# Patient Record
Sex: Female | Born: 1940 | Race: Black or African American | Hispanic: No | Smoking: Never smoker
Health system: Southern US, Community
[De-identification: ages and names within clinical notes are randomized; demographics above are authoritative.]

## PROBLEM LIST (undated history)

## (undated) DIAGNOSIS — J301 Allergic rhinitis due to pollen: Secondary | ICD-10-CM

## (undated) DIAGNOSIS — E7800 Pure hypercholesterolemia, unspecified: Secondary | ICD-10-CM

## (undated) DIAGNOSIS — J302 Other seasonal allergic rhinitis: Secondary | ICD-10-CM

## (undated) DIAGNOSIS — E782 Mixed hyperlipidemia: Secondary | ICD-10-CM

## (undated) DIAGNOSIS — I5030 Unspecified diastolic (congestive) heart failure: Secondary | ICD-10-CM

## (undated) DIAGNOSIS — N1832 Chronic kidney disease, stage 3b: Secondary | ICD-10-CM

## (undated) DIAGNOSIS — I779 Disorder of arteries and arterioles, unspecified: Secondary | ICD-10-CM

## (undated) DIAGNOSIS — I509 Heart failure, unspecified: Secondary | ICD-10-CM

## (undated) DIAGNOSIS — I2510 Atherosclerotic heart disease of native coronary artery without angina pectoris: Secondary | ICD-10-CM

## (undated) DIAGNOSIS — M1990 Unspecified osteoarthritis, unspecified site: Secondary | ICD-10-CM

## (undated) DIAGNOSIS — I10 Essential (primary) hypertension: Secondary | ICD-10-CM

## (undated) DIAGNOSIS — I4891 Unspecified atrial fibrillation: Secondary | ICD-10-CM

## (undated) HISTORY — PX: TOTAL ABDOMINAL HYSTERECTOMY: SHX209

## (undated) HISTORY — DX: Pure hypercholesterolemia, unspecified: E78.00

## (undated) HISTORY — DX: Other seasonal allergic rhinitis: J30.2

## (undated) HISTORY — DX: Unspecified osteoarthritis, unspecified site: M19.90

## (undated) HISTORY — DX: Allergic rhinitis due to pollen: J30.1

## (undated) HISTORY — DX: Essential (primary) hypertension: I10

## (undated) HISTORY — PX: ANKLE SURGERY: SHX546

## (undated) HISTORY — PX: OTHER SURGICAL HISTORY: SHX169

## (undated) HISTORY — PX: TOTAL KNEE ARTHROPLASTY: SHX125

## (undated) HISTORY — PX: TONSILLECTOMY: SHX5217

---

## 2004-08-05 ENCOUNTER — Ambulatory Visit: Payer: Self-pay | Admitting: Orthopedic Surgery

## 2004-08-24 ENCOUNTER — Ambulatory Visit: Payer: Self-pay | Admitting: Orthopedic Surgery

## 2004-08-24 ENCOUNTER — Inpatient Hospital Stay (HOSPITAL_COMMUNITY): Admission: RE | Admit: 2004-08-24 | Discharge: 2004-08-27 | Payer: Self-pay | Admitting: Orthopedic Surgery

## 2004-10-04 ENCOUNTER — Ambulatory Visit: Payer: Self-pay | Admitting: Orthopedic Surgery

## 2004-12-06 ENCOUNTER — Ambulatory Visit: Payer: Self-pay | Admitting: Orthopedic Surgery

## 2005-03-07 ENCOUNTER — Ambulatory Visit: Payer: Self-pay | Admitting: Orthopedic Surgery

## 2006-04-18 IMAGING — CR DG KNEE 1-2V PORT*L*
2 series · 2 of 2 positions shown · non-contrast
Comparison: [facility]ad.

CLINICAL DATA: Post lt total knee replacement.
 PORTABLE LEFT KNEE:

[view not recorded (1 of 2)]
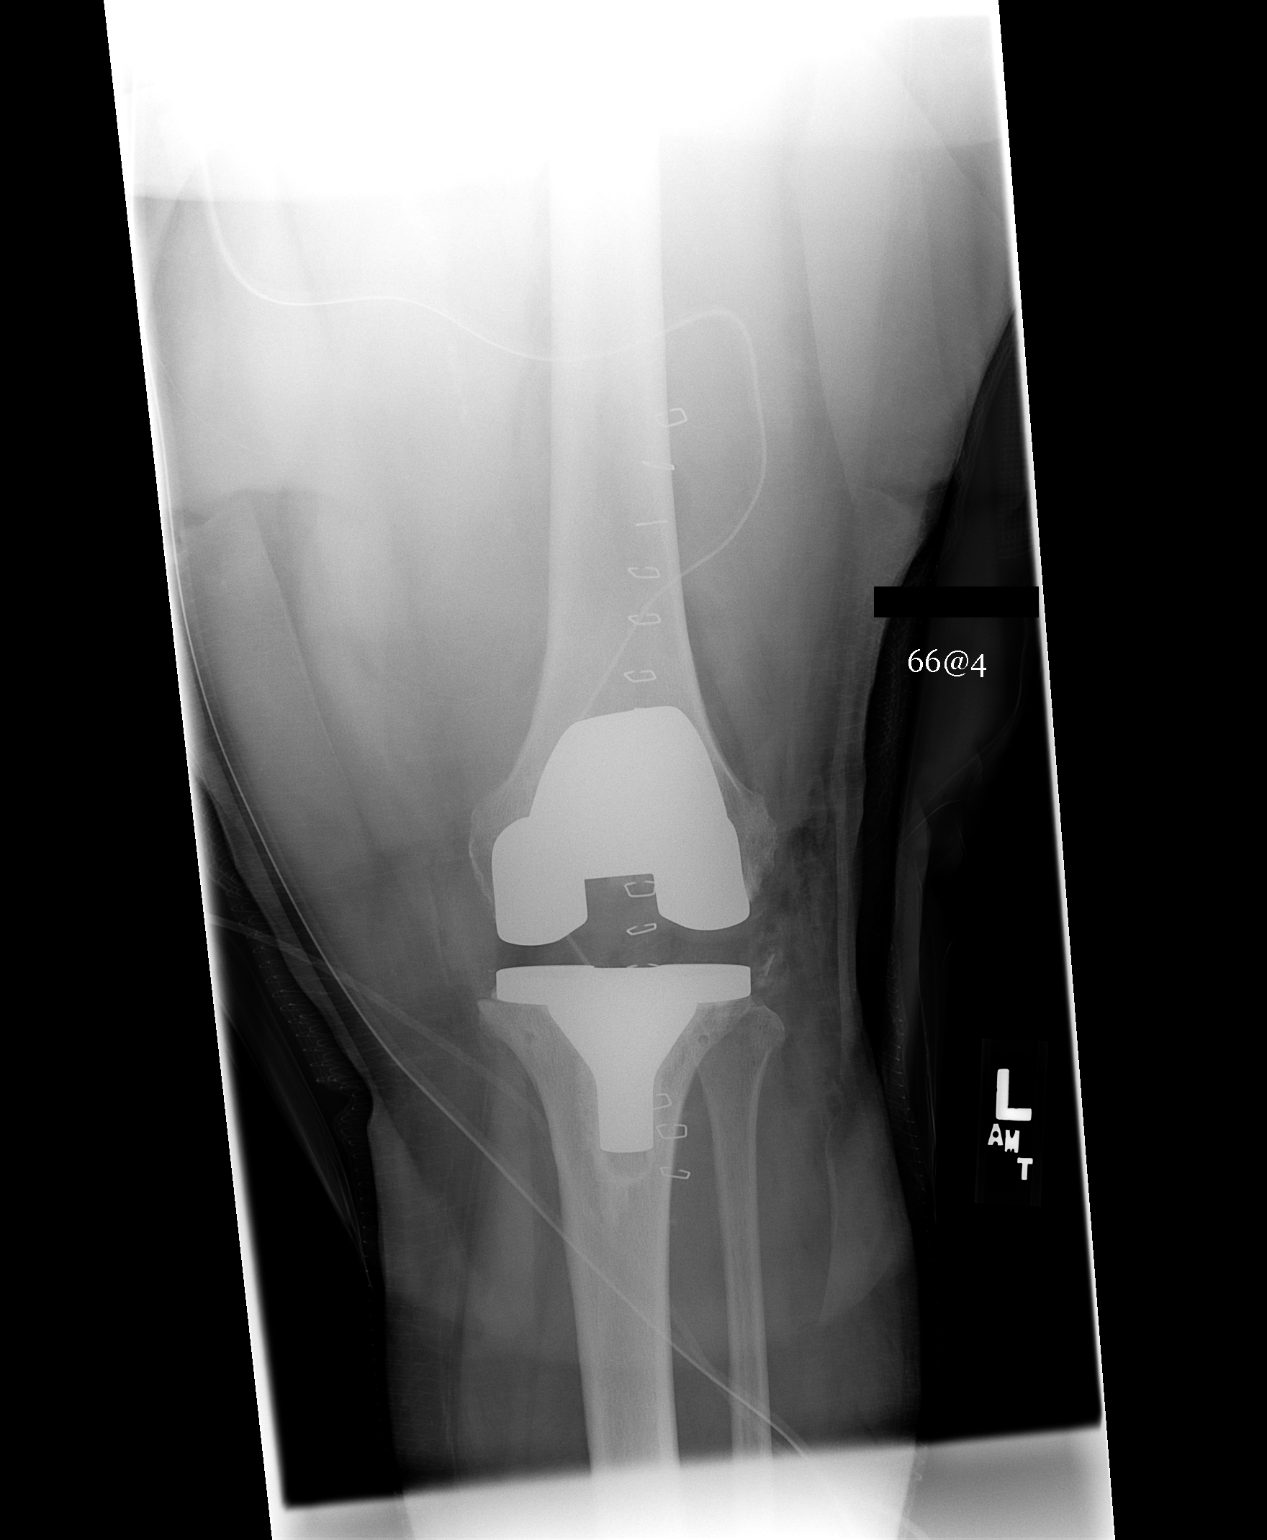

[view not recorded (2 of 2)]
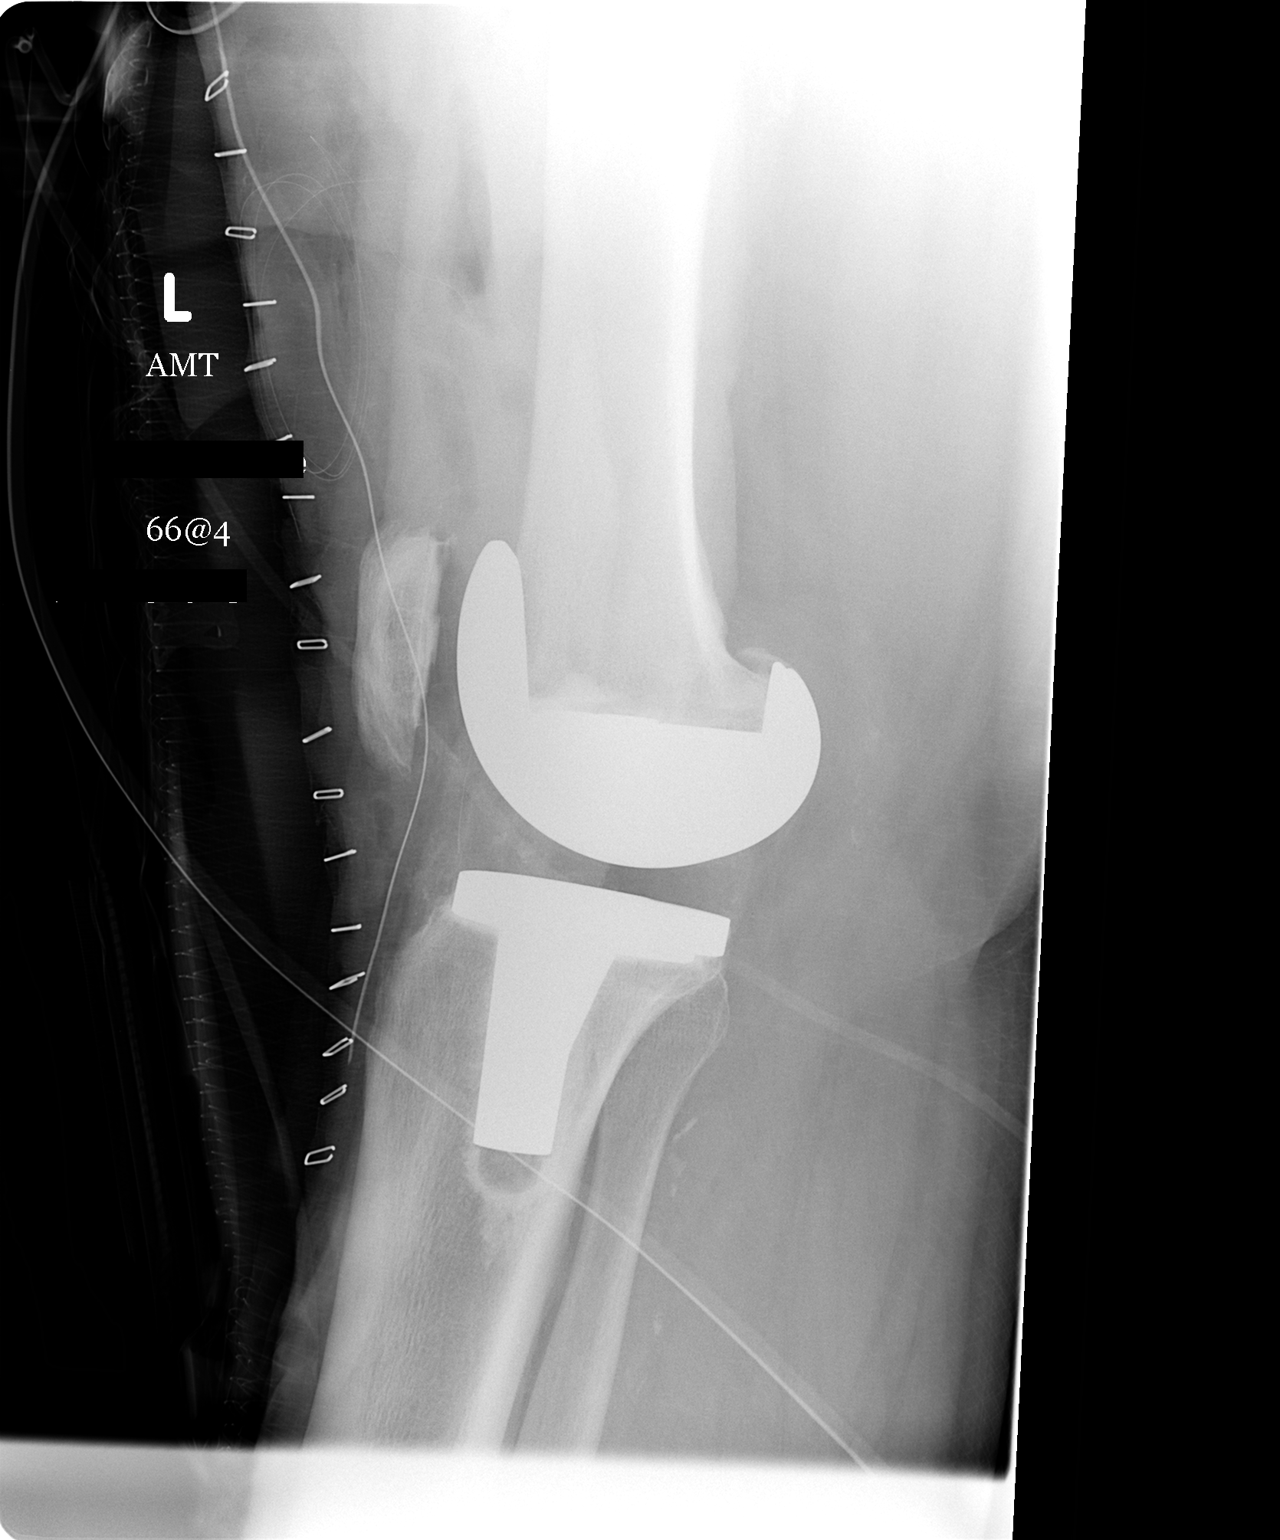

[2 of 2 positions shown; findings below may reference images not displayed]

AP and cross table lateral views show the patient status post left total knee replacement.  Good position and alignment of the prosthetic components and bony elements in two views.  There is a surgical drain in the joint space.
IMPRESSION: Good position and alignment following left total knee arthroplasty.

## 2009-11-04 ENCOUNTER — Encounter: Payer: Self-pay | Admitting: Orthopedic Surgery

## 2009-11-18 ENCOUNTER — Ambulatory Visit: Payer: Self-pay | Admitting: Orthopedic Surgery

## 2009-11-18 DIAGNOSIS — Z96659 Presence of unspecified artificial knee joint: Secondary | ICD-10-CM

## 2010-04-13 ENCOUNTER — Encounter: Payer: Self-pay | Admitting: Orthopedic Surgery

## 2010-07-06 NOTE — Letter (Signed)
Summary: History form  History form   Imported By: Jacklynn Ganong 11/23/2009 12:47:08  _____________________________________________________________________  External Attachment:    Type:   Image     Comment:   External Document

## 2010-07-06 NOTE — Letter (Signed)
Summary: Permanent handic placard  Permanent handic placard   Imported By: Cammie Sickle 11/10/2009 09:27:55  _____________________________________________________________________  External Attachment:    Type:   Image     Comment:   External Document

## 2010-07-06 NOTE — Letter (Signed)
Summary: Handicapp sticker renewal  Handicapp sticker renewal   Imported By: Jacklynn Ganong 04/13/2010 16:15:26  _____________________________________________________________________  External Attachment:    Type:   Image     Comment:   External Document

## 2010-07-06 NOTE — Assessment & Plan Note (Signed)
Summary: RECK/XR LEFT TOTAL KNEE/MEDICARE/BSF   Vital Signs:  Patient profile:   70 year old female Height:      63 inches Weight:      302 pounds Pulse rate:   80 / minute Resp:     16 per minute  Vitals Entered By: Ether Griffins (November 18, 2009 10:51 AM)  Visit Type:  follow up on total knee Referring Provider:  self Primary Provider:  Dr. Salvatore Decent  CC:  follow up on total knee.  History of Present Illness: I saw Katherine Hanson in the office today for a followup visit.  She is a 70 years old woman with the complaint of:  follow up on left total knee today with xrays.  08/24/2004 TKA left knee, Depuy Implant.  Meds: Benicar/HCT, Lopressor, Pravastatin, Mobic, Zyrtec, Travatan eye drops.  We have not seen this patient in over 3 years.she does take some Mobic for other joint pain.    Allergies (verified): No Known Drug Allergies  Past History:  Past Medical History: high blood pressure high cholesterol arthritis glaucoma hay fever  Past Surgical History: cyst in breast not by Dr. Romeo Apple cyst on wrist not by Dr. Romeo Apple tonsillectomy not by Dr. Romeo Apple hysterectomy not by Dr. Romeo Apple left total knee arthroplasty done by Dr. Romeo Apple broken ankle  Family History: Family History of Arthritis  Social History: Patient is widowed.  retired no smoking no alcohol one cup of caffeine per day  Review of Systems Constitutional:  Denies weight loss, weight gain, fever, chills, and fatigue. Cardiovascular:  Denies chest pain, palpitations, fainting, and murmurs. Respiratory:  Denies short of breath, wheezing, couch, tightness, pain on inspiration, and snoring . Gastrointestinal:  Denies heartburn, nausea, vomiting, diarrhea, constipation, and blood in your stools. Genitourinary:  Denies frequency, urgency, difficulty urinating, painful urination, flank pain, and bleeding in urine. Neurologic:  Denies numbness, tingling, unsteady gait, dizziness,  tremors, and seizure. Musculoskeletal:  Complains of stiffness and muscle pain; denies joint pain, swelling, instability, redness, and heat. Endocrine:  Denies excessive thirst, exessive urination, and heat or cold intolerance. Psychiatric:  Denies nervousness, depression, anxiety, and hallucinations. Skin:  Denies changes in the skin, poor healing, rash, itching, and redness. HEENT:  Complains of redness and watering; denies blurred or double vision and eye pain. Immunology:  Complains of seasonal allergies; denies sinus problems and allergic to bee stings. Hemoatologic:  Denies easy bleeding and brusing.  Physical Exam  Additional Exam:  she is 54 300 pounds short in stature vital signs are stable  She has excellent extension of her knee, normal power in extension.  Flexion 120  Stability is normal in the coronal plane as well as the AP plane   Impression & Recommendations:  Problem # 1:  TOTAL KNEE FOLLOW-UP (ICD-V43.65) Assessment Comment Only  x-rays show normal alignment of the prosthesis and the patella no loosening  Compared to her previous films there appears to be some joint space narrowing medially.  This can be followed with x-ray I do not think it significant  Orders: New Patient Level II (69629) Knee x-ray,  3 views (52841)  Patient Instructions: 1)  2 years xr tka left

## 2010-10-22 NOTE — Consult Note (Signed)
NAME:  Katherine Hanson, Katherine Hanson               ACCOUNT NO.:  [id number]   MEDICAL RECORD NO.:  [id number]          PATIENT TYPE:  INP   LOCATION:  A335                          FACILITY:  [facility]   PHYSICIAN:  Osvaldo Shipper, MD     DATE OF BIRTH:  [birth year]   DATE OF CONSULTATION:  DATE OF DISCHARGE:                                   CONSULTATION   CONSULTING PHYSICIAN:  Osvaldo Shipper, M.D.   REASON FOR CONSULTATION:  Medical problems.   HISTORY OF PRESENT ILLNESS:  This is a 70 year old African American female  with a past medical history of hypertension, arthritis, glaucoma,  hypercholesterolemia who was admitted to the hospital for a left knee  replacement secondary to osteoarthritis.  At the time of evaluation, the  patient was very sedated with her PCA pump.  She was arousable, however,  could not give a proper history and would go back to sleep.  Again on  arousal, the patient denied any kind of complaints at this time.  Specifically, she denied any chest pain, shortness of breath.  She also  denied any nausea, vomiting, abdominal pain.  The patient's daughter was in  the room who mentioned that the patient did not have any active medical  problems at the time of admission and that her blood pressure is usually  under very good control.   MEDICATIONS:  1.  Benicar/HCT 20/12.5, one tablet daily.  2.  Lopressor 100 mg at nighttime.  3.  Zetia 10 mg daily.  4.  Flonase occasionally.  5.  Zyrtec occasionally.  6.  Mobic on a p.r.n. basis.   MEDICATIONS IN THE HOSPITAL:  1.  PCA pump with Dilaudid.  2.  Skelaxin 800 mg every eight hours.  3.  Colace 100 mg b.i.d.  4.  Ferrous sulfate t.i.d.  5.  Dulcolax suppository as needed.  6.  Milk of Magnesia as needed.  7.  Hydrochlorothiazide/Avapro combination.  8.  Lopressor 100 mg twice daily.  9.  Zetia 10 mg daily.  10. Lovenox for DVT prophylaxis.  11. Ancef.   No known drug allergies.   PAST MEDICAL HISTORY:  1.   Hypertension.  2.  Arthritis.  3.  Hypercholesterolemia.  4.  Glaucoma.   SURGICAL HISTORY:  1.  Cyst removed from her wrist.  2.  Hysterectomy.  3.  Tonsillectomy.  4.  Breast biopsy and cyst removal.   SOCIAL HISTORY:  She is widowed and retired, does not smoke or drink, has a  high school education.   FAMILY HISTORY:  Significant just for arthritis.   REVIEW OF SYSTEMS:  Difficult to do on this patient because of her sedation.   PHYSICAL EXAMINATION:  VITAL SIGNS:  She is afebrile.  Heart rate is 89,  respiratory rate is 20, blood pressure is 119/82, saturating 99%.  GENERAL:  Showed a very obese female in no apparent distress, very sedated,  wakes up on calling her name, however, is not able to give proper history.  HEENT:  Pupils were small but reacting and equal bilaterally.  Oral mucous  membranes were moist.  No pallor or icterus was appreciated.  NECK:  Soft and supple.  LUNGS:  Clear to auscultation.  CARDIOVASCULAR:  S1 S2 normal.  Regular.  No murmurs appreciated.  ABDOMEN:  Soft, nontender, nondistended, very obese.  Bowel sounds were  heard.  No organomegaly was appreciated.  EXTREMITIES:  Without edema.  NEUROLOGIC:  The patient was asleep, however, arousable.  No gross  neurological deficits were appreciated.   LABORATORY DATA:  White count 6.1, hemoglobin 12.7, platelet count 235.  These are from August 19, 2004, none available from this time.  Coag profile,  at that time, was also normal.  Her BMP was also normal from August 19, 2004.  No current labs available.   IMAGING STUDIES:  Show good positioning alignment following left total knee  arthroplasty.   IMPRESSION:  This is a 70 year old African American female with past medical  problems for hypertension who presented for a left knee arthroplasty,  following end stage osteoarthritis.  The patient is on a beta-blocker as  well as a diuretic and ARB.  Her blood pressures are optimally controlled at  this  time.   PLAN:  1.  Continue her antihypertensive agents at this time.  Adjustments will be      made and the patient may go back to her outpatient meds once she is      discharged.  2.  We agree with DVT prophylaxis with Lovenox and TED stockings and SCD's.  3.  Recommend cutting down dose of the PCA pump and to hold for sedation.  4.  For diet, we recommend a heart healthy diet.   We thank you for the opportunity to for allowing [place] to consult upon this  patient, and we will be happy to follow this patient along for any medical  needs.      GK/MEDQ  D:  08/24/2004  T:  08/24/2004  Job:  [id number]   cc:   Vickki Hearing, M.D.  Fax: [phone]

## 2010-10-22 NOTE — H&P (Signed)
NAME:  Katherine Hanson, Katherine Hanson               ACCOUNT NO.:  [id number]   MEDICAL RECORD NO.:  [id number]          PATIENT TYPE:  AMB   LOCATION:  DAY                           FACILITY:  [facility]   PHYSICIAN:  Vickki Hearing, M.D.DATE OF BIRTH:  01-31-1941   DATE OF ADMISSION:  DATE OF DISCHARGE:  LH                                HISTORY & PHYSICAL   CHIEF COMPLAINT:  Left knee pain.   HISTORY OF PRESENT ILLNESS:  Ms. Katherine Hanson has end-stage osteoarthritis  of the left knee.  She is 70 years old.  She has trouble with activities of  daily living.  She has had pain for four or five years and it has gotten  worse in the last month.  She is 70 years old and has given informed consent  for a left total knee replacement.   PAST MEDICAL HISTORY:  1.  Seasonal allergies.  2.  Glaucoma.  3.  Joint swelling.  4.  Joint pain.  5.  History of ulcers.   ALLERGIES:  None known.   MEDICAL PROBLEMS:  1.  Hypertension.  2.  Glaucoma.  3.  Arthritis.  4.  Hypercholesterolemia.   SURGERIES:  1.  Cyst removed from her wrist.  2.  Hysterectomy.  3.  Tonsillectomy.  4.  Breast biopsy and cyst removal.   CURRENT MEDICATIONS:  1.  Benicar 20/12.5 mg.  2.  Lopressor 100 mg.  3.  Zetia 10 mg.  4.  Flonase occasionally.  5.  Zyrtec.  6.  Mobic.   FAMILY HISTORY:  Arthritis.   FAMILY PHYSICIAN:  Dr. Salvatore Decent.   SOCIAL HISTORY:  She is widowed and retired.  She does not smoke or drink.  She has a high school education plus four years of college.   PHYSICAL EXAMINATION:  VITAL SIGNS:  Weight 282 pounds, pulse 74,  respiratory rate 18.  GENERAL:  Appearance normal.  HEENT:  No significant abnormalities.  NECK:  Supple.  CHEST:  Clear.  HEART:  Rate and rhythm were normal.  ABDOMEN:  Soft.  EXTREMITIES:  The patient's left knee is tender over the medial compartment.  It has a small joint effusion.  Muscle tone and strength were normal.  The  ligaments were stable with varus alignment.   Her knee shows range of motion,  which is 115 degrees.  Her leg size limits further flexion.   IMPRESSION:  Left knee osteoarthritis.   PLAN:  Left total knee replacement.      SEH/MEDQ  D:  08/23/2004  T:  08/23/2004  Job:  [id number]

## 2010-10-22 NOTE — Discharge Summary (Signed)
NAME:  Katherine Hanson, Katherine Hanson               ACCOUNT NO.:  [id number]   MEDICAL RECORD NO.:  [id number]          PATIENT TYPE:  INP   LOCATION:  A335                          FACILITY:  [facility]   PHYSICIAN:  Vickki Hearing, M.D.DATE OF BIRTH:  March 09, 1941   DATE OF ADMISSION:  08/24/2004  DATE OF DISCHARGE:  03/24/2006LH                                 DISCHARGE SUMMARY   ADMISSION DIAGNOSIS:  Osteoarthritis of the left knee.   DATE OF SURGERY:  August 24, 2004.   OPERATING SURGEON:  Vickki Hearing, M.D.   IMPLANT USED:  DePuy total knee system, 2.5 femur, 2 tibia, 35 mm patella,  12.5 posterior stabilized insert.   ESTIMATED BLOOD LOSS:  Minimal.   COMPLICATIONS:  No complications were noted during the procedure.   HISTORY:  Ms. Theard is a 70 year old African-American female with a past  medical history of hypertension, arthritis, glaucoma, hypercholesterolemia,  and obesity who presented with end-stage left knee osteoarthritis.  She had  a primary complaint of pain and she consented to undergo left total knee  replacement.   HOSPITAL COURSE:  The patient was admitted on August 24, 2004, had the  surgery done on August 24, 2004, and then started a postoperative total knee  protocol, including Lovenox, CPM, and 24 hours of antibiotics.  She did  well.  She progressed to ambulation of 15 feet, passive range of motion 0 to  60.  She tolerated the CPM well.   She had a medical consult, no additional interventions were necessary.   Her hemoglobin at discharge was 10.1.   DISCHARGE MEDICATIONS:  She will resume all previous medications with the  additions of:  1.  Colace 100 mg b.i.d.  2.  Lovenox 30 mg subcutaneously q.12h. x25 days.  3.  Zetia 10 mg daily.  4.  Hydrochlorothiazide 12.5 mg daily.  5.  Lortab one tablet q.4h. p.r.n. pain.  6.  Avapro 150 mg p.o. daily.  7.  Iron 45 mg p.o. t.i.d.  8.  Lopressor 100 mg daily.   FOLLOWUP:  The patient will followup within the next  month for x-rays and  clinical evaluation.   Her staples will be taken out by home care on postoperative day #14, which  is September 07, 2004.   DISPOSITION:  Home.   CONDITION ON DISCHARGE:  Improved.      SEH/MEDQ  D:  08/27/2004  T:  08/27/2004  Job:  [id number]

## 2010-10-22 NOTE — Op Note (Signed)
NAME:  COSTELLA, SCHWARZ               ACCOUNT NO.:  [id number]   MEDICAL RECORD NO.:  [id number]          PATIENT TYPE:  AMB   LOCATION:  DAY                           FACILITY:  [facility]   PHYSICIAN:  Vickki Hearing, M.D.DATE OF BIRTH:  [birth year]   DATE OF PROCEDURE:  08/24/2004  DATE OF DISCHARGE:                                 OPERATIVE REPORT   INDICATIONS FOR PROCEDURE:  This is a 70 year old female with end-stage  osteoarthritis of the left knee, severe varus deformity with bone on bone x-  ray changes and pain. She has trouble with her activities of daily living,  and presented for a left total knee replacement.   PREOPERATIVE DIAGNOSES:  Osteoarthritis of the left knee.   POSTOPERATIVE DIAGNOSES:  Osteoarthritis of the left knee.   PROCEDURE:  Left total knee arthroplasty.   SURGEON:  Vickki Hearing, M.D.   ASSISTANT:  None.   ANESTHESIA:  Spinal.   IMPLANTS:  DePuy 2.5 femur, 2.0 tibia, 35.0 mm patella 12.5 stabilized  insert.   COMPLICATIONS:  None. The counts were correct at the end of the procedure.   ESTIMATED BLOOD LOSS:  Blood loss was minimal.   DESCRIPTION OF PROCEDURE:  Ms. Knarr was properly identified in the preop  holding area. The left knee was marked for surgery and countersigned by the  surgeon. She was given Ancef one hour prior to the  incision and taken to  the operating room, after her history and physical was updated and given a  spinal anesthetic. She had a Foley catheter placed. A tourniquet was placed  on the left thigh.  The left lower extremity was prepped and draped using  sterile technique.  After draping, a time-out was taken as required. We  confirmed that the antibiotics were given, the procedure was a left total  knee, that the patient was [place] [employer], and that the implants were  available and in the hospital.   A straight incision was made over the left knee, centered over the patella.  The subcutaneous tissue was divided.   A medial arthrotomy was performed. The  patella was everted and the tibia was subluxated forward. The PCL was  removed.  The ACL was removed.  The medial and lateral menisci were removed,  and a medial release was done. The tibial guide was set for a 0 degree cut,  and 10 mm of bone was resected from the high lateral side.  The tibia sized  to a size 2.0.   The femoral canal was entered with a 3/8th inch drill bit. It was  decompressed. The femoral cut was set for size #9 and 9 mm of bone was  resected. The extension gap was checked and the extension gap felt tight, so  2 mm of additional bone was removed and then a 10 mm insert fit easily into  the gap.  We then put the 4 in 1 cutting block on after measuring the femur  to a 2.5.  We set it for external rotation and made our four cuts.  The  flexion gap was then  checked and the  10 mm block fit well.   The box cut was made and the trial implants were inserted. The  flexion gap  seemed to be tight at terminal flexion with the tray lifting up, so further  additional release of residual PCL fibers were removed. This opened up the  flexion gap and allowed the tray to stay still during flexion. We trialed  with a 12.5, and it seemed to give the best flexion stability while still  maintaining full extension.   The patella was cut from 23 down to 12,  and three peg holes were made. The  patella was trialed with the trial implants in place, and no lateral release  was needed. The knee felt stable in flexion and extension, and the trial  components were removed. The knee was irrigated and the bone was dried.   The prosthesis components were implanted and cemented in place. Excess  cement was removed. The 10.0 and 12.5 inserts were trialed again.  The 12.5  gave the best stability and a 12.5 insert was placed.   The pain pump catheter was then inserted into the joint, and 30 cc of  Marcaine was injected around the soft tissues of the joint,  and the extensor  mass mechanism was closed with Bralon suture, followed by insertion of a  Hemovac in the subcu tissue, and then #0 and #2-0 Monocryl suture was used  to close the subcu layer.  Skin staples were used to close the skin. A  sterile bandage and a cryo cuff were applied. Tourniquet was released after  1 hour and 20 minutes, and sterile dressings had been applied.  The patient was then taken to the recovery room in stable condition.   POSTOPERATIVE PLAN:  Routine protocol for a total knee.      SEH/MEDQ  D:  08/24/2004  T:  08/24/2004  Job:  [id number]

## 2011-01-19 ENCOUNTER — Ambulatory Visit (INDEPENDENT_AMBULATORY_CARE_PROVIDER_SITE_OTHER): Payer: Medicare Other | Admitting: Orthopedic Surgery

## 2011-01-19 ENCOUNTER — Other Ambulatory Visit: Payer: Self-pay

## 2011-01-19 ENCOUNTER — Encounter: Payer: Self-pay | Admitting: Orthopedic Surgery

## 2011-01-19 VITALS — Resp 20 | Ht 63.0 in | Wt 314.0 lb

## 2011-01-19 DIAGNOSIS — M19079 Primary osteoarthritis, unspecified ankle and foot: Secondary | ICD-10-CM

## 2011-01-19 MED ORDER — TRAMADOL-ACETAMINOPHEN 37.5-325 MG PO TABS: 1.0000 | ORAL_TABLET | Freq: Four times a day (QID) | ORAL | Status: AC | PRN

## 2011-01-19 NOTE — Patient Instructions (Signed)
Wear AFO for most of the time you are walking   Continue meloxicam  Use ultracet for pain if meloxicam not working   [employer], [phone] to schedule an appointment to get the brace made

## 2011-01-19 NOTE — Progress Notes (Signed)
Chief complaint: right ankle pain  HPI:(5) A 70 year old female status post open treatment internal fixation of the RIGHT ankle in 2008 by Dr. Rosetta Posner of [place],  [place] presents with a one-month history of gradual onset of total 8/10 intermittent RIGHT ankle pain associated with swelling and associated with standing for long periods of time.  Swelling is relieved by elevation.  The patient is on arthritis medication.  ROS:(2) weight gain, eyeswatering, snoring, seasonal allergies   PFSH: (1) ankle surgery right 2008  Physical Exam(12) GENERAL: normal development, obesity   CDV: pulses are normal   Skin: surgical scars knee and ankle   Lymph: nodes were not palpable/normal  Psychiatric: awake, alert and oriented  Neuro: normal sensation  MSK Ambulation is normal without assistive device, there is no limping noted.   1RIGHT ankle there is a lateral scar which is minimally tender to palpation there is a medial transverse scar which appears to be posttraumatic which is transverse and tender and sunken and from scarring, proximal and distal to this there is swelling. 2 Range of motion plantar flexion is 15 dorsiflexion is 5 3 Strength assessment motor is 5 over 5 for plantar and dorsiflexion 4  The ankle is stable to anterior drawer test   Assessment: X-rays are obtained in the office of the RIGHT ankle Separate x-ray report 3 views RIGHT ankle Reason for x-ray ankle pain status post open treatment internal fixation  The ankle mortise is reduced.  There is a 5 hole lateral plate with a healed lateral malleolar fracture.  There is one malleolar screw on the medial side for previous medial malleolus fracture which is healed.  Her is narrowing of the ankle joint space circumferentially.  Impression posttraumatic osteoarthritis ankle joint, with internal fixation.  Plan: Recommend AFO brace.  Continue anti-inflammatory.  Start Ultracet for pain.  Followup as needed

## 2011-01-20 ENCOUNTER — Ambulatory Visit: Payer: Medicare Other | Admitting: Orthopedic Surgery

## 2013-09-05 ENCOUNTER — Ambulatory Visit (INDEPENDENT_AMBULATORY_CARE_PROVIDER_SITE_OTHER): Payer: Medicare Other | Admitting: Orthopedic Surgery

## 2013-09-05 ENCOUNTER — Ambulatory Visit (INDEPENDENT_AMBULATORY_CARE_PROVIDER_SITE_OTHER): Payer: Medicare Other

## 2013-09-05 VITALS — BP 175/88 | Ht 63.0 in | Wt 292.0 lb

## 2013-09-05 DIAGNOSIS — M25569 Pain in unspecified knee: Secondary | ICD-10-CM

## 2013-09-05 DIAGNOSIS — M1710 Unilateral primary osteoarthritis, unspecified knee: Secondary | ICD-10-CM

## 2013-09-05 DIAGNOSIS — M179 Osteoarthritis of knee, unspecified: Secondary | ICD-10-CM

## 2013-09-05 DIAGNOSIS — M25561 Pain in right knee: Secondary | ICD-10-CM | POA: Insufficient documentation

## 2013-09-05 DIAGNOSIS — M7050 Other bursitis of knee, unspecified knee: Secondary | ICD-10-CM | POA: Insufficient documentation

## 2013-09-05 DIAGNOSIS — IMO0002 Reserved for concepts with insufficient information to code with codable children: Secondary | ICD-10-CM

## 2013-09-05 MED ORDER — CELECOXIB 200 MG PO CAPS: 200.0000 mg | ORAL_CAPSULE | Freq: Two times a day (BID) | ORAL | Status: DC

## 2013-09-05 NOTE — Progress Notes (Signed)
Patient ID: Katherine Hanson, female   DOB: [birth year], 73 y.o.   MRN: [id number]  Chief Complaint  Patient presents with  . Knee Pain    Right knee pain, no injury.   73 years old right knee pain for 3 weeks gradual onset it is over the medial part of the knee just off the joint line over the bursa. No fracture or history in this leg no injury. The pain is throbbing she reports it as a 10 is worse with walking. She's had some swelling in that area.  Her review of systems is negative for everything except seasonal allergies joint pain and stiffness frequency and watering of the eyes  No allergies  She does have some hypertension high cholesterol ankle, family history of arthritis she is widowed retired Runner, broadcasting/film/videoteacher she doesn't smoke or drink. She had a left knee surgery but listed no surgeries.  She does have some moderate obesity. General appearance is normal, the patient is alert and oriented x3 with normal mood and affect. Ambulation so said she has a limp she is favoring her right leg BP 175/88  Ht 5\' 3"  (1.6 m)  Wt 292 lb (132.45 kg)  BMI 51.74 kg/m2  Right knee: She is tender over the medial bursa not over the joint line although the joint line does have effusion her knee flexion is 110 the knee is stable the motor exam is normal the scans intact without rash is a good distal pulse mild distal edema normal sensation.  She is using a cane  X-rays show severe medial compartment arthritis grade 4 however at this time she has bursitis of the knee and get an injection  Procedure Injection right  knee Medications: Ethyl chloride for topical anesthetic. Lidocaine 1% 3 cc. 40 mg of Depo-Medrol per mL, 1 mL. Verbal consent. Timeout to confirm site of injection as rt knee Alcohol was used to clean the skin followed by ethyl chloride to anesthetize the skin. A lateral approach was used to inject the knee with lidocaine and Depo-Medrol.  No complications were noted. A sterile bandage was  applied.  Encounter Diagnoses  Name Primary?  . Knee pain, right   . Pes anserinus bursitis   . Arthritis of knee, degenerative Yes    Meds ordered this encounter  Medications  . celecoxib (CELEBREX) 200 MG capsule    Sig: Take 1 capsule (200 mg total) by mouth 2 (two) times daily.    Dispense:  60 capsule    Refill:  2    mobic advil and ibuprofen

## 2013-09-05 NOTE — Patient Instructions (Addendum)
Bursitis knee   Pes Anserinus Syndrome with Rehab The pes anserine, also known as the goose's foot, is an area of the shinbone (tibia) near the knee joint where the tendons of three of the muscles of the thigh insert into the bone. These muscles are important for bending the knee and bringing the leg across the body. Just underneath the three tendons that attach at the pes anserinus exists a fluid filled sac (bursa) that is meant to reduce the friction between the tendons and the tibia. Pes anserinus syndrome is a condition that is characterized by inflammation of the bursa (bursitis) and/ or tendonitis (inflammation of the tendon) and may cause severe pain in the lower portion of the inner (medial) side of the knee. SYMPTOMS   Pain and inflammation over the lower portion of the medial side of the knee.  Pain that worsens as the duration of an activity increases.  Pain that worsens when bending the knee, especially against resistance.  A crackling sound (crepitation) when the tendon or bursa is moved or touched. CAUSES  Bursitis and tendonitis are usually characterized as overuse injuries. Common mechanisms of injury include:  Stress placed on the knee from a sudden increase in the intensity, frequency, or duration of training.  Direct trauma to the upper leg (less common). RISK INCREASES WITH:  Endurance sports (distance running or triathletes).  Making changes to or beginning a new training program.  Sports that place stress on the muscles that insert at the pes anserinus, such as those that require pivoting, cutting, or jumping.  Improper training.  Poor strength and flexibility  Failure to warm-up properly before activity.  Improper knee alignment ( knock knees).  Arthritis of the knee. PREVENTION  Warm up and stretch properly before activity.  Allow for adequate recovery between workouts.  Maintain physical fitness:  Strength, flexibility, and  endurance.  Cardiovascular fitness.  Learn and use training methods that will reduce the stress placed on the pes anserinus.  Arch supports (orthotics) may be helpful for those with flat feet. PROGNOSIS  If treated properly, then the symptoms of pes anserinus syndrome usually resolve within 6 weeks.  RELATED COMPLICATIONS   Persistent and potentially chronic pain if the condition is not treated properly.  Re-injury if activity is resumed before the injury is allowed to heal completely, or if one resumes improper training habits. TREATMENT Treatment initially involves the use of ice and medication to help reduce pain and inflammation. The use of strengthening and stretching exercises may help reduce pain with activity. These exercises may be performed at home or with a therapist. Individuals who have flat feet may find benefit in wearing arch supports in their shoes. Some individuals find that compression bandages or knee sleeves help reduce symptoms. Your caregiver may recommend a corticosteroid injection to help reduce inflammation. If symptoms persist, despite conservative treatment for greater than 6 months, then surgery may be recommended.  MEDICATION   If pain medication is necessary, then nonsteroidal anti-inflammatory medications, such as aspirin and ibuprofen, or other minor pain relievers, such as acetaminophen, are often recommended.  Do not take pain medication for 7 days before surgery.  Prescription pain relievers may be given if deemed necessary by your caregiver. Use only as directed and only as much as you need.  Corticosteroid injections may be given by your caregiver. These injections should be reserved for the most serious cases, because they may only be given a certain number of times. SEEK MEDICAL CARE IF:  Treatment  seems to offer no benefit, or the condition worsens.  Any medications produce adverse side effects.

## 2013-09-16 ENCOUNTER — Telehealth: Payer: Self-pay | Admitting: Orthopedic Surgery

## 2013-09-16 NOTE — Telephone Encounter (Signed)
Katherine PellantFrances Bree asked for the nurse to call her. Has a question about her knee pain [phone]

## 2013-09-17 NOTE — Telephone Encounter (Signed)
Patient was scheduled an appointment for 10/10/13.

## 2013-10-10 ENCOUNTER — Ambulatory Visit (INDEPENDENT_AMBULATORY_CARE_PROVIDER_SITE_OTHER): Payer: Medicare Other | Admitting: Orthopedic Surgery

## 2013-10-10 VITALS — BP 131/80 | Ht 63.0 in | Wt 292.0 lb

## 2013-10-10 DIAGNOSIS — IMO0002 Reserved for concepts with insufficient information to code with codable children: Secondary | ICD-10-CM

## 2013-10-10 DIAGNOSIS — M1710 Unilateral primary osteoarthritis, unspecified knee: Secondary | ICD-10-CM

## 2013-10-10 DIAGNOSIS — M179 Osteoarthritis of knee, unspecified: Secondary | ICD-10-CM

## 2013-10-10 MED ORDER — TRAMADOL-ACETAMINOPHEN 37.5-325 MG PO TABS: 1.0000 | ORAL_TABLET | ORAL | Status: DC | PRN

## 2013-10-10 NOTE — Progress Notes (Signed)
Patient ID: Katherine Hanson, female   DOB: Nov 05, 1940, 73 y.o.   MRN: [id number]  Chief Complaint  Patient presents with  . Follow-up    Recheck right knee pain, s/p injection on 09-05-13.   HISTORY:  Right knee possible repeat injection the injection did help. Patient has for another injection but since that time most of her pain has resolved.  Her review of systems is negative  He denies catching locking or giving way  She is angulating without assistive device her knee looks good without swelling mild crepitance normal range of motion stability and strength skin is normal  Encounter Diagnosis  Name Primary?  . OA (osteoarthritis) of knee Yes   Meds ordered this encounter  Medications  . traMADol-acetaminophen (ULTRACET) 37.5-325 MG per tablet    Sig: Take 1-2 tablets by mouth every 4 (four) hours as needed.    Dispense:  40 tablet    Refill:  1

## 2013-10-10 NOTE — Patient Instructions (Signed)
Start ultracet   Continue mobic

## 2014-01-14 ENCOUNTER — Other Ambulatory Visit: Payer: Self-pay | Admitting: *Deleted

## 2014-01-14 MED ORDER — TRAMADOL-ACETAMINOPHEN 37.5-325 MG PO TABS: 1.0000 | ORAL_TABLET | ORAL | Status: DC | PRN

## 2014-09-28 ENCOUNTER — Other Ambulatory Visit: Payer: Self-pay | Admitting: Orthopedic Surgery

## 2015-01-01 ENCOUNTER — Telehealth: Payer: Self-pay | Admitting: Orthopedic Surgery

## 2015-01-01 NOTE — Telephone Encounter (Signed)
Patient called to request refill of medication of 01/14/14:  traMADol-acetaminophen (ULTRACET) 37.5-325 MG per tablet [[id number]] - patient ph# is [phone]

## 2015-01-05 ENCOUNTER — Other Ambulatory Visit: Payer: Self-pay | Admitting: *Deleted

## 2015-01-05 MED ORDER — TRAMADOL-ACETAMINOPHEN 37.5-325 MG PO TABS: 1.0000 | ORAL_TABLET | ORAL | Status: DC | PRN

## 2015-01-06 NOTE — Telephone Encounter (Signed)
Prescription faxed to patient's pharmacy. 

## 2015-06-30 ENCOUNTER — Other Ambulatory Visit: Payer: Self-pay | Admitting: Orthopedic Surgery

## 2015-07-09 ENCOUNTER — Other Ambulatory Visit: Payer: Self-pay | Admitting: *Deleted

## 2015-07-09 MED ORDER — TRAMADOL-ACETAMINOPHEN 37.5-325 MG PO TABS: 1.0000 | ORAL_TABLET | ORAL | Status: DC | PRN

## 2015-12-22 ENCOUNTER — Telehealth: Payer: Self-pay | Admitting: Orthopedic Surgery

## 2015-12-22 NOTE — Telephone Encounter (Signed)
Patient called asking for refill of Tramadol.  I reviewed patient's chart, and she was last seen by Dr Romeo AppleHarrison 10/10/2013; therefore, relayed to patient she will need to schedule appointment and discuss medication refill at time of new appointment with Dr Romeo AppleHarrison. Offered appointment; patient said she will call back to schedule.

## 2016-01-28 ENCOUNTER — Ambulatory Visit (INDEPENDENT_AMBULATORY_CARE_PROVIDER_SITE_OTHER): Payer: Medicare Other | Admitting: Orthopedic Surgery

## 2016-01-28 ENCOUNTER — Encounter: Payer: Self-pay | Admitting: Orthopedic Surgery

## 2016-01-28 VITALS — BP 167/94 | HR 78 | Ht 62.0 in | Wt 267.0 lb

## 2016-01-28 DIAGNOSIS — G8929 Other chronic pain: Secondary | ICD-10-CM | POA: Diagnosis not present

## 2016-01-28 DIAGNOSIS — M25561 Pain in right knee: Secondary | ICD-10-CM | POA: Diagnosis not present

## 2016-01-28 MED ORDER — TRAMADOL-ACETAMINOPHEN 37.5-325 MG PO TABS: 1.0000 | ORAL_TABLET | ORAL | 5 refills | Status: DC | PRN

## 2016-01-28 NOTE — Progress Notes (Signed)
Patient ID: Katherine Hanson, female   DOB: 10-26-1940, 75 y.o.   MRN: [id number]  Chief Complaint  Patient presents with  . Follow-up    RIGHT KNEE PAIN    HPI Katherine Hanson is a 75 y.o. female.  75 years old known osteoarthritis right knee controlled with Ultracet. She came in for a clinical exam to get a refill on her pain medicine HPI  Review of Systems Review of Systems Normal neuro  Denies fever   Past Medical History:  Diagnosis Date  . Arthritis   . Hay fever   . High cholesterol   . HTN (hypertension)   . Seasonal allergies      Examination BP (!) 167/94   Pulse 78   Ht 5\' 2"  (1.575 m)   Wt 267 lb (121.1 kg)   BMI 48.83 kg/m   Gen. appearance the patient's appearance is normal with normal grooming and hygiene The patient is oriented to person place and time Mood and affect are normal She has a slight gait abnormality with a limp   Ortho Exam Knee flexion is only to 95 full extension, no swelling  No ankle edema  Medical decision-making  Diagnosis Osteoarthritis right knee  Data no new data  Plan (risk) Continue Ultracet follow-up with us as needed  Meds ordered this encounter  Medications  . metFORMIN (GLUCOPHAGE) 500 MG tablet    Sig: Take by mouth 2 (two) times daily with a meal.  . losartan-hydrochlorothiazide (HYZAAR) 100-25 MG tablet    Sig: Take 1 tablet by mouth daily.  . traMADol-acetaminophen (ULTRACET) 37.5-325 MG tablet    Sig: Take 1-2 tablets by mouth every 4 (four) hours as needed.    Dispense:  40 tablet    Refill:  5     Fuller CanadaStanley Nini Cavan, MD 01/28/2016 3:42 PM

## 2018-04-30 ENCOUNTER — Encounter: Payer: Self-pay | Admitting: Orthopedic Surgery

## 2018-04-30 ENCOUNTER — Encounter

## 2018-04-30 ENCOUNTER — Ambulatory Visit (INDEPENDENT_AMBULATORY_CARE_PROVIDER_SITE_OTHER): Payer: Medicare Other

## 2018-04-30 ENCOUNTER — Ambulatory Visit (INDEPENDENT_AMBULATORY_CARE_PROVIDER_SITE_OTHER): Payer: Medicare Other | Admitting: Orthopedic Surgery

## 2018-04-30 VITALS — BP 199/95 | HR 90 | Ht 63.0 in | Wt 275.0 lb

## 2018-04-30 DIAGNOSIS — M25531 Pain in right wrist: Secondary | ICD-10-CM | POA: Diagnosis not present

## 2018-04-30 DIAGNOSIS — Z6841 Body Mass Index (BMI) 40.0 and over, adult: Secondary | ICD-10-CM

## 2018-04-30 DIAGNOSIS — M654 Radial styloid tenosynovitis [de Quervain]: Secondary | ICD-10-CM | POA: Diagnosis not present

## 2018-04-30 NOTE — Progress Notes (Signed)
Chief Complaint  Patient presents with  . Wrist Pain    right wrist / base of right thumb painful/ swollen x 3weeks no injury    77 year old female diabetic presents with a 3-week history of pain and swelling over the right thumb with painful ulnar deviation and pain with picking up small objects she is already on meloxicam for arthritis she reports a dull aching pain over the right radial side of the wrist associated with swelling in the same area, denies trauma.  Review of systems joint pain no numbness or tingling no paresthesias  Past Medical History:  Diagnosis Date  . Arthritis   . Hay fever   . High cholesterol   . HTN (hypertension)   . Seasonal allergies     BP (!) 199/95   Pulse 90   Ht 5\' 3"  (1.6 m)   Wt 275 lb (124.7 kg)   BMI 48.71 kg/m   She is awake alert and oriented x3 mood and affect are normal intended as stated her mood is pleasant her gait is normal her right wrist is swollen tender to palpation over the first extensor compartment normal range of motion with painful ulnar deviation wrist joint is stable grip strength is normal skin is intact pulses are excellent temperature is normal epitrochlear lymph nodes are negative normal sensation is noted  The left wrist is not swollen not tender has a negative Finkelstein's with normal range of motion  X-ray shows mild CMC joint arthritis  Impression Encounter Diagnoses  Name Primary?  . Right wrist pain Yes  . De Quervain's tenosynovitis, right   . Body mass index 45.0-49.9, adult (HCC)   . Morbid obesity (HCC)     Plan wrist splint 6 weeks follow-up  We used a small thumb splint universal 7 inch  BMI 48 The patient meets the AMA guidelines for Morbid (severe) obesity with a BMI > 40.0 and I have recommended weight loss.

## 2018-04-30 NOTE — Patient Instructions (Signed)
De Quervain Tenosynovitis  Tendons attach muscles to bones. They also help with joint movements. When tendons become irritated or swollen, it is called tendinitis.  The extensor pollicis brevis (EPB) tendon connects the EPB muscle to a bone that is near the base of the thumb. The EPB muscle helps to straighten and extend the thumb. De Quervain tenosynovitis is a condition in which the EPB tendon lining (sheath) becomes irritated, thickened, and swollen. This condition is sometimes called stenosing tenosynovitis. This condition causes pain on the thumb side of the back of the wrist.  What are the causes?  Causes of this condition include:   Activities that repeatedly cause your thumb and wrist to extend.   A sudden increase in activity or change in activity that affects your wrist.    What increases the risk?  This condition is more likely to develop in:   Females.   People who have diabetes.   Women who have recently given birth.   People who are over 40 years of age.   People who do activities that involve repeated hand and wrist motions, such as tennis, racquetball, volleyball, gardening, and taking care of children.   People who do heavy labor.   People who have poor wrist strength and flexibility.   People who do not warm up properly before activities.    What are the signs or symptoms?  Symptoms of this condition include:   Pain or tenderness over the thumb side of the back of the wrist when your thumb and wrist are not moving.   Pain that gets worse when you straighten your thumb or extend your thumb or wrist.   Pain when the injured area is touched.   Locking or catching of the thumb joint while you bend and straighten your thumb.   Decreased thumb motion due to pain.   Swelling over the affected area.    How is this diagnosed?  This condition is diagnosed with a medical history and physical exam. Your health care provider will ask for details about your injury and ask about your  symptoms.  How is this treated?  Treatment may include the use of icing and medicines to reduce pain and swelling. You may also be advised to wear a splint or brace to limit your thumb and wrist motion. In less severe cases, treatment may also include working with a physical therapist to strengthen your wrist and calm the irritation around your EPB tendon sheath. In severe cases, surgery may be needed.  Follow these instructions at home:  If you have a splint or brace:   Wear it as told by your health care provider. Remove it only as told by your health care provider.   Loosen the splint or brace if your fingers become numb and tingle, or if they turn cold and blue.   Keep the splint or brace clean and dry.  Managing pain, stiffness, and swelling   If directed, apply ice to the injured area.  ? Put ice in a plastic bag.  ? Place a towel between your skin and the bag.  ? Leave the ice on for 20 minutes, 2-3 times per day.   Move your fingers often to avoid stiffness and to lessen swelling.   Raise (elevate) the injured area above the level of your heart while you are sitting or lying down.  General instructions   Return to your normal activities as told by your health care provider. Ask your health care provider   what activities are safe for you.   Take over-the-counter and prescription medicines only as told by your health care provider.   Keep all follow-up visits as told by your health care provider. This is important.   Do not drive or operate heavy machinery while taking prescription pain medicine.  Contact a health care provider if:   Your pain, tenderness, or swelling gets worse, even if you have had treatment.   You have numbness or tingling in your wrist, hand, or fingers on the injured side.  This information is not intended to replace advice given to you by your health care provider. Make sure you discuss any questions you have with your health care provider.  Document Released: 05/23/2005  Document Revised: 10/29/2015 Document Reviewed: 07/29/2014  Elsevier Interactive Patient Education  2018 [employer]

## 2018-05-01 ENCOUNTER — Telehealth: Payer: Self-pay | Admitting: Orthopedic Surgery

## 2018-05-01 NOTE — Telephone Encounter (Signed)
I called to advise Aspercreme or Biofreeze

## 2018-05-01 NOTE — Telephone Encounter (Signed)
Patient called following office visit yesterday, 04/30/18, to ask what the name of the topical cream she can apply to hand/wrist. States it was not on her instructions. Please advise. Ph# [phone] (H) [phone] [facility](M

## 2018-05-23 ENCOUNTER — Telehealth: Payer: Self-pay | Admitting: Orthopedic Surgery

## 2018-05-23 NOTE — Telephone Encounter (Signed)
Patient called to ask Dr Romeo AppleHarrison if she may have Tramadol prescribed as in past:  traMADol-acetaminophen (ULTRACET) 37.5-325 MG tablet 1-2 tablet Oral Every 4 hours PRN  - [facility] is [facility], [address], [place]

## 2018-05-23 NOTE — Telephone Encounter (Signed)
NO

## 2018-05-24 NOTE — Telephone Encounter (Signed)
Called yesterday no answer, called again today, no answer "memory is full"   She should continue Meloxicam / use ice after injection  Unable to reach

## 2018-06-07 ENCOUNTER — Telehealth: Payer: Self-pay | Admitting: Orthopedic Surgery

## 2018-06-07 NOTE — Telephone Encounter (Signed)
Patient called and canceled the appointment for 06/13/18 at 11:30  I offered to reschedule it but she said no, she would call us back.

## 2018-06-13 ENCOUNTER — Ambulatory Visit: Payer: Medicare Other | Admitting: Orthopedic Surgery

## 2023-02-08 ENCOUNTER — Other Ambulatory Visit (HOSPITAL_COMMUNITY): Payer: Self-pay | Admitting: Nephrology

## 2023-02-08 DIAGNOSIS — R809 Proteinuria, unspecified: Secondary | ICD-10-CM

## 2023-02-08 DIAGNOSIS — N184 Chronic kidney disease, stage 4 (severe): Secondary | ICD-10-CM

## 2023-02-14 ENCOUNTER — Ambulatory Visit (HOSPITAL_COMMUNITY)
Admission: RE | Admit: 2023-02-14 | Discharge: 2023-02-14 | Disposition: A | Discharge status: Home / Self Care | Payer: Medicare PPO | Source: Ambulatory Visit | Attending: Nephrology | Admitting: Nephrology

## 2023-02-14 DIAGNOSIS — R809 Proteinuria, unspecified: Secondary | ICD-10-CM | POA: Insufficient documentation

## 2023-02-14 DIAGNOSIS — N184 Chronic kidney disease, stage 4 (severe): Secondary | ICD-10-CM | POA: Insufficient documentation

## 2023-03-31 ENCOUNTER — Inpatient Hospital Stay: Payer: Medicare PPO

## 2023-03-31 ENCOUNTER — Inpatient Hospital Stay: Payer: Medicare PPO | Attending: Oncology | Admitting: Oncology

## 2023-03-31 ENCOUNTER — Encounter: Payer: Self-pay | Admitting: Oncology

## 2023-03-31 VITALS — BP 133/67 | HR 71 | Temp 98.1°F | Ht 63.0 in | Wt 272.0 lb

## 2023-03-31 DIAGNOSIS — Z7901 Long term (current) use of anticoagulants: Secondary | ICD-10-CM | POA: Diagnosis not present

## 2023-03-31 DIAGNOSIS — I4891 Unspecified atrial fibrillation: Secondary | ICD-10-CM | POA: Insufficient documentation

## 2023-03-31 DIAGNOSIS — N2581 Secondary hyperparathyroidism of renal origin: Secondary | ICD-10-CM | POA: Insufficient documentation

## 2023-03-31 DIAGNOSIS — D472 Monoclonal gammopathy: Secondary | ICD-10-CM | POA: Diagnosis present

## 2023-03-31 DIAGNOSIS — E1122 Type 2 diabetes mellitus with diabetic chronic kidney disease: Secondary | ICD-10-CM | POA: Insufficient documentation

## 2023-03-31 DIAGNOSIS — I129 Hypertensive chronic kidney disease with stage 1 through stage 4 chronic kidney disease, or unspecified chronic kidney disease: Secondary | ICD-10-CM | POA: Insufficient documentation

## 2023-03-31 DIAGNOSIS — N184 Chronic kidney disease, stage 4 (severe): Secondary | ICD-10-CM | POA: Diagnosis not present

## 2023-03-31 NOTE — Progress Notes (Signed)
[facility] at [facility] HEMATOLOGY NEW VISIT  [name], MD  REASON FOR REFERRAL: M spike  HISTORY OF PRESENT ILLNESS: Katherine Hanson 82 y.o. female referred by her nephrologist for M spike.  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  Patient has no complaints today.  She reports feeling fatigued occasionally but not on a regular basis, dyspnea on exertion occasionally.  She denies fever, chills, weight loss, loss of appetite, bone pain.   The patient has a history of smoking, but quit in her thirties. She denies any alcohol use. There is no family history of cancer.   I have reviewed the past medical history, past surgical history, social history and family history with the patient   ALLERGIES:  is allergic to penicillins.  MEDICATIONS:  Current Outpatient Medications  Medication Sig Dispense Refill   allopurinol (ZYLOPRIM) 300 MG tablet Take 300 mg by mouth daily.     amLODipine (NORVASC) 5 MG tablet Take 5 mg by mouth daily.     atorvastatin (LIPITOR) 20 MG tablet Take 20 mg by mouth daily.     cetirizine (ZYRTEC) 10 MG tablet      chlorthalidone (HYGROTON) 25 MG tablet Take 25 mg by mouth daily.     clopidogrel (PLAVIX) 75 MG tablet Take 75 mg by mouth daily.     ELIQUIS 5 MG TABS tablet Take 1 tablet by mouth 2 (two) times daily.     fluticasone (FLONASE) 50 MCG/ACT nasal spray      FML FORTE 0.25 % ophthalmic suspension      furosemide (LASIX) 20 MG tablet Take 20 mg by mouth daily as needed.     JARDIANCE 10 MG TABS tablet Take 10 mg by mouth daily.     latanoprost (XALATAN) 0.005 % ophthalmic solution 1 drop at bedtime.     losartan (COZAAR) 100 MG tablet Take by mouth.     losartan-hydrochlorothiazide (HYZAAR) 100-25 MG tablet Take 1 tablet by mouth daily.     LUMIGAN 0.01 % SOLN      meloxicam (MOBIC) 15 MG tablet      metFORMIN  (GLUCOPHAGE) 500 MG tablet Take by mouth 2 (two) times daily with a meal.     metoprolol (LOPRESSOR) 100 MG tablet      pravastatin (PRAVACHOL) 40 MG tablet      silver sulfADIAZINE (SILVADENE) 1 % cream Apply topically.     traMADol-acetaminophen (ULTRACET) 37.5-325 MG tablet Take 1-2 tablets by mouth every 4 (four) hours as needed. 40 tablet 5   triamcinolone (NASACORT ALLERGY 24HR) 55 MCG/ACT AERO nasal inhaler Place into the nose.     Vitamin D, Ergocalciferol, (DRISDOL) 1.25 MG (50000 UNIT) CAPS capsule Take by mouth.     No current facility-administered medications for this visit.     REVIEW OF SYSTEMS:   Constitutional: Denies fevers, chills or night sweats Eyes: Denies blurriness of vision Ears, nose, mouth, throat, and face: Denies mucositis or sore throat Respiratory: Denies cough, dyspnea or wheezes Cardiovascular: Denies palpitation, chest discomfort or lower extremity swelling Gastrointestinal:  Denies nausea, heartburn or change in bowel habits Skin: Denies abnormal skin rashes Lymphatics: Denies new lymphadenopathy or easy bruising Neurological:Denies numbness, tingling or new weaknesses Behavioral/Psych: Mood is stable, no new changes  All other systems were reviewed with the patient and are negative.  PHYSICAL EXAMINATION:   Vitals:   03/31/23 1100  BP: 133/67  Pulse: 71  Temp: 98.1 F (36.7 C)  SpO2: 99%    GENERAL:alert, no distress and comfortable LUNGS: clear to auscultation and percussion with normal breathing effort HEART: regular rate & rhythm and no murmurs and no lower extremity edema ABDOMEN:abdomen soft, non-tender and normal bowel sounds Musculoskeletal:no cyanosis of digits and no clubbing  NEURO: alert & oriented x 3 with fluent speech  LABORATORY DATA:  I have reviewed the data as listed under records from her nephrologist Dr. Wolfgang Phoenix Labs from 02/14/2023: SPEP: M spike: 0.3, IFE: Showed biclonal IgG protein with lambda specificity IgG:  1251, IgA: 84, IgM: 56 FK LC: 29.7, FL LC: 64.6, ratio: 2.17 CMP: Creatinine: 1.96, calcium: 10, albumin: 4.5 UPEP: M spike: Not observed, IFE: No evidence of monoclonal protein  CBC: Hemoglobin: 14.1, hematocrit: 42.6, MCV: 95, WBC: 6.3, platelets: 188 Iron: 80, TSAT: 27, TIBC: 297 Uric acid: 5.4 Vitamin B12: 1238, folate: 11.5   ASSESSMENT & PLAN:  Monoclonal (M) protein disease, multiple 'M' protein Elevated biclonal M spike, likely secondary to kidney disease or inflammation. No M spike in urine. No symptoms of multiple myeloma (fatigue, bone pain, fever, night sweats).  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   CKD (chronic kidney disease) stage 4, GFR 15-29 ml/min (HCC) Managed by nephrologist, Dr. Wolfgang Phoenix. No new concerns discussed. -Continue current management plan with nephrologist.   Orders Placed This Encounter  Procedures   CBC with Differential/Platelet    Standing Status:   Future    Standing Expiration Date:   03/30/2024   Comprehensive metabolic panel    Standing Status:   Future    Standing Expiration Date:   03/30/2024   Multiple Myeloma Panel (SPEP&IFE w/QIG)    Standing Status:   Future    Standing Expiration Date:   03/30/2024   Kappa/Lambda Light Chains, Free, With Ratio, 24Hr. Urine    Standing Status:   Future    Standing Expiration Date:   03/30/2024    The total time spent in the appointment was 25 minutes encounter with patients including review of chart and various tests results, discussions about plan of care and coordination of care plan   All questions were answered. The patient knows to call the clinic with any problems, questions or concerns. No barriers to learning was detected.   Cindie Crumbly, MD 10/25/202412:02 PM

## 2023-03-31 NOTE — Patient Instructions (Signed)
VISIT SUMMARY:  Miss Katherine Hanson, you were referred to our clinic due to an abnormal protein detected in your blood. We discussed your history of kidney disease, atrial fibrillation, arthritis, and vitamin D deficiency. We reviewed your current medications and overall health status.  YOUR PLAN:  -MONOCLONAL GAMMOPATHY OF UNDETERMINED SIGNIFICANCE (MGUS): MGUS is a condition where an abnormal protein is found in the blood. It is usually benign but requires monitoring. We will recheck your labs in 3 months to see if the protein levels have changed. If the protein is gone, we will follow up in 6 months and consider discharging you from the clinic.  -CHRONIC KIDNEY DISEASE: Chronic kidney disease means your kidneys are not working as well as they should. You will continue to follow the management plan set by your nephrologist, Dr. Jodi Hanson.  -VITAMIN D DEFICIENCY: Vitamin D deficiency means you have lower than normal levels of vitamin D, which can cause fatigue and other issues. You should continue taking your weekly vitamin D supplement as prescribed.  -ATRIAL FIBRILLATION: Atrial fibrillation is an irregular and often rapid heart rate that can increase your risk of strokes. You should continue taking Eliquis as prescribed to manage this condition.  INSTRUCTIONS:  Please recheck your labs in 3 months to monitor the protein levels in your blood. If the protein is gone, we will follow up in 6 months and consider discharging you from the clinic. Continue to follow the management plan set by your nephrologist, Dr. [name], for your kidney disease. Keep taking your weekly vitamin D supplement and Eliquis as prescribed. Follow up with your primary care doctor and specialists as needed.

## 2023-03-31 NOTE — Assessment & Plan Note (Signed)
Managed by nephrologist, Dr. Wolfgang Phoenix. No new concerns discussed. -Continue current management plan with nephrologist.

## 2023-03-31 NOTE — Assessment & Plan Note (Addendum)
Elevated biclonal M spike, likely secondary to kidney disease or inflammation. No M spike in urine. No symptoms of multiple myeloma (fatigue, bone pain, fever, night sweats).  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

## 2023-06-15 ENCOUNTER — Inpatient Hospital Stay: Payer: PPO | Attending: Oncology

## 2023-06-15 ENCOUNTER — Other Ambulatory Visit: Payer: Self-pay | Admitting: *Deleted

## 2023-06-15 DIAGNOSIS — N184 Chronic kidney disease, stage 4 (severe): Secondary | ICD-10-CM | POA: Insufficient documentation

## 2023-06-15 DIAGNOSIS — D472 Monoclonal gammopathy: Secondary | ICD-10-CM

## 2023-06-15 LAB — CBC WITH DIFFERENTIAL/PLATELET
Abs Immature Granulocytes: 0.02 10*3/uL (ref 0.00–0.07)
Basophils Absolute: 0 10*3/uL (ref 0.0–0.1)
Basophils Relative: 1 %
Eosinophils Absolute: 0.2 10*3/uL (ref 0.0–0.5)
Eosinophils Relative: 3 %
HCT: 42.2 % (ref 36.0–46.0)
Hemoglobin: 14.1 g/dL (ref 12.0–15.0)
Immature Granulocytes: 0 %
Lymphocytes Relative: 29 %
Lymphs Abs: 1.9 10*3/uL (ref 0.7–4.0)
MCH: 32.3 pg (ref 26.0–34.0)
MCHC: 33.4 g/dL (ref 30.0–36.0)
MCV: 96.6 fL (ref 80.0–100.0)
Monocytes Absolute: 0.5 10*3/uL (ref 0.1–1.0)
Monocytes Relative: 8 %
Neutro Abs: 3.8 10*3/uL (ref 1.7–7.7)
Neutrophils Relative %: 59 %
Platelets: 207 10*3/uL (ref 150–400)
RBC: 4.37 MIL/uL (ref 3.87–5.11)
RDW: 13.3 % (ref 11.5–15.5)
WBC: 6.4 10*3/uL (ref 4.0–10.5)
nRBC: 0 % (ref 0.0–0.2)

## 2023-06-15 LAB — COMPREHENSIVE METABOLIC PANEL
ALT: 18 U/L (ref 0–44)
AST: 22 U/L (ref 15–41)
Albumin: 4.3 g/dL (ref 3.5–5.0)
Alkaline Phosphatase: 89 U/L (ref 38–126)
Anion gap: 11 (ref 5–15)
BUN: 28 mg/dL — ABNORMAL HIGH (ref 8–23)
CO2: 26 mmol/L (ref 22–32)
Calcium: 9.8 mg/dL (ref 8.9–10.3)
Chloride: 97 mmol/L — ABNORMAL LOW (ref 98–111)
Creatinine, Ser: 1.73 mg/dL — ABNORMAL HIGH (ref 0.44–1.00)
GFR, Estimated: 29 mL/min — ABNORMAL LOW (ref >60)
Glucose, Bld: 116 mg/dL — ABNORMAL HIGH (ref 70–99)
Potassium: 3.7 mmol/L (ref 3.5–5.1)
Sodium: 134 mmol/L — ABNORMAL LOW (ref 135–145)
Total Bilirubin: 0.6 mg/dL (ref 0.0–1.2)
Total Protein: 8.3 g/dL — ABNORMAL HIGH (ref 6.5–8.1)

## 2023-06-22 ENCOUNTER — Inpatient Hospital Stay: Payer: PPO | Admitting: Oncology

## 2023-06-25 LAB — MULTIPLE MYELOMA PANEL, SERUM
Albumin SerPl Elph-Mcnc: 4.3 g/dL (ref 2.9–4.4)
Albumin/Glob SerPl: 1.3 (ref 0.7–1.7)
Alpha 1: 0.3 g/dL (ref 0.0–0.4)
Alpha2 Glob SerPl Elph-Mcnc: 1.1 g/dL — ABNORMAL HIGH (ref 0.4–1.0)
B-Globulin SerPl Elph-Mcnc: 0.9 g/dL (ref 0.7–1.3)
Gamma Glob SerPl Elph-Mcnc: 1.2 g/dL (ref 0.4–1.8)
Globulin, Total: 3.4 g/dL (ref 2.2–3.9)
IgA: 83 mg/dL (ref 64–422)
IgG (Immunoglobin G), Serum: 1260 mg/dL (ref 586–1602)
IgM (Immunoglobulin M), Srm: 63 mg/dL (ref 26–217)
M Protein SerPl Elph-Mcnc: 0.4 g/dL — ABNORMAL HIGH
Total Protein ELP: 7.7 g/dL (ref 6.0–8.5)

## 2023-06-29 DIAGNOSIS — D472 Monoclonal gammopathy: Secondary | ICD-10-CM | POA: Diagnosis not present

## 2023-06-30 LAB — KAPPA/LAMBDA LIGHT CHAINS, FREE, WITH RATIO, 24HR. URINE
FR KAPPA LT CH,24HR: 143.48 mg/(24.h)
FR LAMBDA LT CH,24HR: 43.62 mg/(24.h)
Free Kappa Lt Chains,Ur: 98.95 mg/L — ABNORMAL HIGH (ref 1.17–86.46)
Free Kappa/Lambda Ratio: 3.29 (ref 1.83–14.26)
Free Lambda Lt Chains,Ur: 30.08 mg/L — ABNORMAL HIGH (ref 0.27–15.21)
Total Volume: 1450

## 2023-07-05 ENCOUNTER — Inpatient Hospital Stay (HOSPITAL_BASED_OUTPATIENT_CLINIC_OR_DEPARTMENT_OTHER): Payer: PPO | Admitting: Oncology

## 2023-07-05 VITALS — BP 158/87 | HR 73 | Temp 98.1°F | Resp 18

## 2023-07-05 DIAGNOSIS — N184 Chronic kidney disease, stage 4 (severe): Secondary | ICD-10-CM

## 2023-07-05 DIAGNOSIS — D472 Monoclonal gammopathy: Secondary | ICD-10-CM

## 2023-07-05 NOTE — Patient Instructions (Signed)
VISIT SUMMARY:  complaints. The abnormal protein is likely related to your chronic kidney disease, which is being managed by Dr. Wolfgang Phoenix. All other lab results are normal, and you are not anemic. You also reported no bone pain or fatigue.  YOUR PLAN:  -ABNORMAL M-Spike: An abnormal protein was detected in your blood, which is likely due to your chronic kidney disease. Since you are not experiencing any symptoms like bone pain or fatigue, and your other lab results are normal, we will repeat the testing in six months to monitor your condition.  -CHRONIC KIDNEY DISEASE: Your chronic kidney disease is being managed by Dr. Wolfgang Phoenix. You have no new complaints or symptoms related to this condition, so you should continue with your current management plan under Dr. Lucio Edward care.  -GENERAL HEALTH MAINTENANCE: Your routine labs are normal, and there are no new health concerns. Please continue with your general lifestyle recommendations and schedule a follow-up appointment in a few months.  INSTRUCTIONS:  Please schedule a follow-up appointment in a few months. Additionally, we will repeat the blood test in six months to monitor the abnormal protein.You came in today for a follow-up regarding an abnormal protein found in your blood. You mentioned feeling pretty good overall and did not have any specific

## 2023-07-05 NOTE — Progress Notes (Signed)
[facility] at [facility] HEMATOLOGY FOLLOW-UP VISIT  [name], MD  REASON FOR FOLLOW-UP: Biclonal M spike  ASSESSMENT & PLAN:  Patient is an 83 year old female referred for biclonal M spike   Monoclonal (M) protein disease, multiple 'M' protein Elevated biclonal M spike, likely secondary to kidney disease or inflammation. No M spike in urine. No symptoms of multiple myeloma (fatigue, bone pain, fever, night sweats).  No CRAB criteria.  Repeat labs show stable M spike. 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   CKD (chronic kidney disease) stage 4, GFR 15-29 ml/min (HCC) Managed by nephrologist, Dr. Wolfgang Phoenix. No new concerns discussed. -Continue current management plan with nephrologist.   Orders Placed This Encounter  Procedures   Comprehensive metabolic panel    Standing Status:   Future    Expected Date:   01/05/2024    Expiration Date:   07/04/2024   CBC with Differential/Platelet    Standing Status:   Future    Expected Date:   01/05/2024    Expiration Date:   07/04/2024   IgG, IgA, IgM    Standing Status:   Future    Expected Date:   01/05/2024    Expiration Date:   07/04/2024   Multiple Myeloma Panel (SPEP&IFE w/QIG)    Standing Status:   Future    Expected Date:   01/05/2024    Expiration Date:   07/04/2024   Kappa/lambda light chains    Standing Status:   Future    Expected Date:   01/05/2024    Expiration Date:   07/04/2024    The total time spent in the appointment was 15 minutes encounter with patients including review of chart and various tests results, discussions about plan of care and coordination of care plan   All questions were answered. The patient knows to call the clinic with any problems, questions or concerns. No barriers to learning was detected.  Cindie Crumbly, MD 1/29/20255:20 PM     INTERVAL HISTORY: RUDI KNIPPENBERG 83 y.o. female following for abnormal biclonal M spike.She reports feeling 'pretty good' and denies any current complaints. She is not anemic and all other labs are reported as normal. She is currently under the care of Dr. Wolfgang Phoenix for her kidney disease. She denies any bone pain or fatigue and describes her overall health as 'okay for an old woman.'  I have reviewed the past medical history, past surgical history, social history and family history with the patient   ALLERGIES:  is allergic to penicillins.  MEDICATIONS:  Current Outpatient Medications  Medication Sig Dispense Refill   allopurinol (ZYLOPRIM) 300 MG tablet Take 300 mg by mouth daily.     amLODipine (NORVASC) 5 MG tablet Take 5 mg by mouth daily.     atorvastatin (LIPITOR) 20 MG tablet Take 20 mg by mouth daily.     cetirizine (ZYRTEC) 10 MG tablet      chlorthalidone (HYGROTON) 25 MG tablet Take 25 mg by mouth daily.     clopidogrel (PLAVIX) 75 MG tablet Take 75 mg by mouth daily.     ELIQUIS 5 MG TABS tablet Take 1 tablet by mouth 2 (two) times daily.     fluticasone (FLONASE) 50 MCG/ACT nasal spray      FML FORTE 0.25 % ophthalmic suspension  furosemide (LASIX) 20 MG tablet Take 20 mg by mouth daily as needed.     JARDIANCE 10 MG TABS tablet Take 10 mg by mouth daily.     latanoprost (XALATAN) 0.005 % ophthalmic solution 1 drop at bedtime.     losartan (COZAAR) 100 MG tablet Take by mouth.     losartan-hydrochlorothiazide (HYZAAR) 100-25 MG tablet Take 1 tablet by mouth daily.     LUMIGAN 0.01 % SOLN      meloxicam (MOBIC) 15 MG tablet      metFORMIN (GLUCOPHAGE) 500 MG tablet Take by mouth 2 (two) times daily with a meal.     metoprolol (LOPRESSOR) 100 MG tablet      pravastatin (PRAVACHOL) 40 MG tablet      silver sulfADIAZINE (SILVADENE) 1 % cream Apply topically.     traMADol-acetaminophen (ULTRACET) 37.5-325 MG tablet Take 1-2 tablets by mouth every 4 (four) hours as  needed. 40 tablet 5   triamcinolone (NASACORT ALLERGY 24HR) 55 MCG/ACT AERO nasal inhaler Place into the nose.     Vitamin D, Ergocalciferol, (DRISDOL) 1.25 MG (50000 UNIT) CAPS capsule Take by mouth.     No current facility-administered medications for this visit.     REVIEW OF SYSTEMS:   Constitutional: Denies fevers, chills or night sweats Eyes: Denies blurriness of vision Ears, nose, mouth, throat, and face: Denies mucositis or sore throat Respiratory: Denies cough, dyspnea or wheezes Cardiovascular: Denies palpitation, chest discomfort or lower extremity swelling Gastrointestinal:  Denies nausea, heartburn or change in bowel habits Skin: Denies abnormal skin rashes Lymphatics: Denies new lymphadenopathy or easy bruising Neurological:Denies numbness, tingling or new weaknesses Behavioral/Psych: Mood is stable, no new changes  All other systems were reviewed with the patient and are negative.  PHYSICAL EXAMINATION:   Vitals:   07/05/23 1430  BP: (!) 158/87  Pulse: 73  Resp: 18  Temp: 98.1 F (36.7 C)  SpO2: 94%    GENERAL:alert, no distress and comfortable SKIN: skin color, texture, turgor are normal, no rashes or significant lesions LUNGS: clear to auscultation and percussion with normal breathing effort HEART: regular rate & rhythm and no murmurs and no lower extremity edema ABDOMEN:abdomen soft, non-tender and normal bowel sounds Musculoskeletal:no cyanosis of digits and no clubbing  NEURO: alert & oriented x 3 with fluent speech  LABORATORY DATA:  I have reviewed the data as listed  Lab Results  Component Value Date   WBC 6.4 06/15/2023   NEUTROABS 3.8 06/15/2023   HGB 14.1 06/15/2023   HCT 42.2 06/15/2023   MCV 96.6 06/15/2023   PLT 207 06/15/2023      Component Value Date/Time   NA 134 (L) 06/15/2023 1047   K 3.7 06/15/2023 1047   CL 97 (L) 06/15/2023 1047   CO2 26 06/15/2023 1047   GLUCOSE 116 (H) 06/15/2023 1047   BUN 28 (H) 06/15/2023 1047    CREATININE 1.73 (H) 06/15/2023 1047   CALCIUM 9.8 06/15/2023 1047   PROT 8.3 (H) 06/15/2023 1047   ALBUMIN 4.3 06/15/2023 1047   AST 22 06/15/2023 1047   ALT 18 06/15/2023 1047   ALKPHOS 89 06/15/2023 1047   BILITOT 0.6 06/15/2023 1047   GFRNONAA 29 (L) 06/15/2023 1047      Chemistry      Component Value Date/Time   NA 134 (L) 06/15/2023 1047   K 3.7 06/15/2023 1047   CL 97 (L) 06/15/2023 1047   CO2 26 06/15/2023 1047   BUN 28 (H) 06/15/2023 1047   CREATININE  1.73 (H) 06/15/2023 1047      Component Value Date/Time   CALCIUM 9.8 06/15/2023 1047   ALKPHOS 89 06/15/2023 1047   AST 22 06/15/2023 1047   ALT 18 06/15/2023 1047   BILITOT 0.6 06/15/2023 1047

## 2023-07-05 NOTE — Assessment & Plan Note (Signed)
Elevated biclonal M spike, likely secondary to kidney disease or inflammation. No M spike in urine. No symptoms of multiple myeloma (fatigue, bone pain, fever, night sweats).  No CRAB criteria.  Repeat labs show stable M spike. 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

## 2023-07-05 NOTE — Assessment & Plan Note (Signed)
Managed by nephrologist, Dr. Wolfgang Phoenix. No new concerns discussed. -Continue current management plan with nephrologist.

## 2023-12-15 ENCOUNTER — Other Ambulatory Visit (HOSPITAL_COMMUNITY): Payer: Self-pay | Admitting: Nephrology

## 2023-12-15 DIAGNOSIS — R809 Proteinuria, unspecified: Secondary | ICD-10-CM

## 2023-12-15 DIAGNOSIS — N184 Chronic kidney disease, stage 4 (severe): Secondary | ICD-10-CM

## 2023-12-15 DIAGNOSIS — I129 Hypertensive chronic kidney disease with stage 1 through stage 4 chronic kidney disease, or unspecified chronic kidney disease: Secondary | ICD-10-CM

## 2023-12-15 DIAGNOSIS — E1122 Type 2 diabetes mellitus with diabetic chronic kidney disease: Secondary | ICD-10-CM

## 2023-12-27 ENCOUNTER — Ambulatory Visit (HOSPITAL_COMMUNITY)
Admission: RE | Admit: 2023-12-27 | Discharge: 2023-12-27 | Disposition: A | Discharge status: Home / Self Care | Source: Ambulatory Visit | Attending: Nephrology | Admitting: Nephrology

## 2023-12-27 ENCOUNTER — Inpatient Hospital Stay: Payer: PPO | Attending: Oncology

## 2023-12-27 DIAGNOSIS — N184 Chronic kidney disease, stage 4 (severe): Secondary | ICD-10-CM | POA: Insufficient documentation

## 2023-12-27 DIAGNOSIS — D649 Anemia, unspecified: Secondary | ICD-10-CM | POA: Diagnosis not present

## 2023-12-27 DIAGNOSIS — I129 Hypertensive chronic kidney disease with stage 1 through stage 4 chronic kidney disease, or unspecified chronic kidney disease: Secondary | ICD-10-CM | POA: Diagnosis present

## 2023-12-27 DIAGNOSIS — E1122 Type 2 diabetes mellitus with diabetic chronic kidney disease: Secondary | ICD-10-CM | POA: Diagnosis present

## 2023-12-27 DIAGNOSIS — R809 Proteinuria, unspecified: Secondary | ICD-10-CM | POA: Insufficient documentation

## 2023-12-27 DIAGNOSIS — D472 Monoclonal gammopathy: Secondary | ICD-10-CM | POA: Diagnosis present

## 2023-12-27 LAB — COMPREHENSIVE METABOLIC PANEL WITH GFR
ALT: 40 U/L (ref 0–44)
AST: 36 U/L (ref 15–41)
Albumin: 4.1 g/dL (ref 3.5–5.0)
Alkaline Phosphatase: 127 U/L — ABNORMAL HIGH (ref 38–126)
Anion gap: 12 (ref 5–15)
BUN: 32 mg/dL — ABNORMAL HIGH (ref 8–23)
CO2: 27 mmol/L (ref 22–32)
Calcium: 9.3 mg/dL (ref 8.9–10.3)
Chloride: 94 mmol/L — ABNORMAL LOW (ref 98–111)
Creatinine, Ser: 1.74 mg/dL — ABNORMAL HIGH (ref 0.44–1.00)
GFR, Estimated: 29 mL/min — ABNORMAL LOW (ref >60)
Glucose, Bld: 122 mg/dL — ABNORMAL HIGH (ref 70–99)
Potassium: 3.3 mmol/L — ABNORMAL LOW (ref 3.5–5.1)
Sodium: 133 mmol/L — ABNORMAL LOW (ref 135–145)
Total Bilirubin: 1 mg/dL (ref 0.0–1.2)
Total Protein: 7.9 g/dL (ref 6.5–8.1)

## 2023-12-27 LAB — CBC WITH DIFFERENTIAL/PLATELET
Abs Immature Granulocytes: 0.02 K/uL (ref 0.00–0.07)
Basophils Absolute: 0 K/uL (ref 0.0–0.1)
Basophils Relative: 0 %
Eosinophils Absolute: 0.2 K/uL (ref 0.0–0.5)
Eosinophils Relative: 3 %
HCT: 35.4 % — ABNORMAL LOW (ref 36.0–46.0)
Hemoglobin: 11.2 g/dL — ABNORMAL LOW (ref 12.0–15.0)
Immature Granulocytes: 0 %
Lymphocytes Relative: 24 %
Lymphs Abs: 1.6 K/uL (ref 0.7–4.0)
MCH: 30.2 pg (ref 26.0–34.0)
MCHC: 31.6 g/dL (ref 30.0–36.0)
MCV: 95.4 fL (ref 80.0–100.0)
Monocytes Absolute: 0.6 K/uL (ref 0.1–1.0)
Monocytes Relative: 9 %
Neutro Abs: 4.2 K/uL (ref 1.7–7.7)
Neutrophils Relative %: 64 %
Platelets: 195 K/uL (ref 150–400)
RBC: 3.71 MIL/uL — ABNORMAL LOW (ref 3.87–5.11)
RDW: 14.1 % (ref 11.5–15.5)
WBC: 6.6 K/uL (ref 4.0–10.5)
nRBC: 0 % (ref 0.0–0.2)

## 2023-12-28 LAB — KAPPA/LAMBDA LIGHT CHAINS
Kappa free light chain: 35.1 mg/L — ABNORMAL HIGH (ref 3.3–19.4)
Kappa, lambda light chain ratio: 0.41 (ref 0.26–1.65)
Lambda free light chains: 85.4 mg/L — ABNORMAL HIGH (ref 5.7–26.3)

## 2023-12-28 LAB — IGG, IGA, IGM
IgA: 81 mg/dL (ref 64–422)
IgG (Immunoglobin G), Serum: 1218 mg/dL (ref 586–1602)
IgM (Immunoglobulin M), Srm: 51 mg/dL (ref 26–217)

## 2023-12-29 LAB — MULTIPLE MYELOMA PANEL, SERUM
Albumin SerPl Elph-Mcnc: 3.9 g/dL (ref 2.9–4.4)
Albumin/Glob SerPl: 1.2 (ref 0.7–1.7)
Alpha 1: 0.3 g/dL (ref 0.0–0.4)
Alpha2 Glob SerPl Elph-Mcnc: 1 g/dL (ref 0.4–1.0)
B-Globulin SerPl Elph-Mcnc: 1.1 g/dL (ref 0.7–1.3)
Gamma Glob SerPl Elph-Mcnc: 1.2 g/dL (ref 0.4–1.8)
Globulin, Total: 3.5 g/dL (ref 2.2–3.9)
IgA: 80 mg/dL (ref 64–422)
IgG (Immunoglobin G), Serum: 1267 mg/dL (ref 586–1602)
IgM (Immunoglobulin M), Srm: 56 mg/dL (ref 26–217)
M Protein SerPl Elph-Mcnc: 0.5 g/dL — ABNORMAL HIGH
Total Protein ELP: 7.4 g/dL (ref 6.0–8.5)

## 2024-01-03 ENCOUNTER — Other Ambulatory Visit: Payer: Self-pay | Admitting: *Deleted

## 2024-01-03 ENCOUNTER — Inpatient Hospital Stay: Payer: PPO | Admitting: Oncology

## 2024-01-03 VITALS — BP 168/86 | HR 75 | Temp 98.0°F

## 2024-01-03 DIAGNOSIS — N184 Chronic kidney disease, stage 4 (severe): Secondary | ICD-10-CM

## 2024-01-03 DIAGNOSIS — D649 Anemia, unspecified: Secondary | ICD-10-CM | POA: Diagnosis not present

## 2024-01-03 DIAGNOSIS — D472 Monoclonal gammopathy: Secondary | ICD-10-CM

## 2024-01-03 MED ORDER — POTASSIUM CHLORIDE CRYS ER 20 MEQ PO TBCR: 20.0000 meq | EXTENDED_RELEASE_TABLET | Freq: Every day | ORAL | Status: DC

## 2024-01-03 MED ORDER — POTASSIUM CHLORIDE CRYS ER 20 MEQ PO TBCR: 20.0000 meq | EXTENDED_RELEASE_TABLET | Freq: Every day | ORAL | 0 refills | Status: DC

## 2024-01-03 NOTE — Assessment & Plan Note (Addendum)
 Elevated biclonal M spike, likely secondary to kidney disease or inflammation.  No M spike in urine.  No symptoms of multiple myeloma (fatigue, bone pain, fever, night sweats).   No CRAB criteria.  Repeat labs show stable M spike. Risk stratification: Non IgM, M spike<1.5, Normal FLC ratio: 0.41-->low risk Serum light chains: FKLC:35.1, FLLC: 85.4, FLC ratio: 0.41   -No indication for PET scan at this time -Do not meet criteria for bone marrow biopsy at this time -Discussed with the patient the risk of progression to Multiple myeloma is around 1% per year and that observation is the standard of care.   RTC in 6 months for follow -up with labs.

## 2024-01-03 NOTE — Assessment & Plan Note (Addendum)
 Managed by nephrologist, Dr. Rachele.  No new concerns discussed. Recent ultrasound consistent with medical renal disease  -Continue current management plan with nephrologist.

## 2024-01-03 NOTE — Assessment & Plan Note (Addendum)
 Patient has mild normocytic anemia without symptoms Likely mild iron deficiency or due to anemia of chronic kidney disease  -Will obtain labs including iron panel, ferritin, folate, vitamin B12 with next blood draw

## 2024-01-03 NOTE — Progress Notes (Signed)
 [facility] at [facility]  HEMATOLOGY FOLLOW-UP VISIT  Nancee Deward BROCKS, MD  REASON FOR FOLLOW-UP: Biclonal M spike  ASSESSMENT & PLAN:  Patient is an 83 year old female referred for biclonal M spike  Assessment & Plan MGUS (monoclonal gammopathy of unknown significance) Elevated biclonal M spike, likely secondary to kidney disease or inflammation.  No M spike in urine.  No symptoms of multiple myeloma (fatigue, bone pain, fever, night sweats).   No CRAB criteria.  Repeat labs show stable M spike. Risk stratification: Non IgM, M spike<1.5, Normal FLC ratio: 0.41-->low risk Serum light chains: FKLC:35.1, FLLC: 85.4, FLC ratio: 0.41   -No indication for PET scan at this time -Do not meet criteria for bone marrow biopsy at this time -Discussed with the patient the risk of progression to Multiple myeloma is around 1% per year and that observation is the standard of care.   RTC in 6 months for follow -up with labs.  CKD (chronic kidney disease) stage 4, GFR 15-29 ml/min (HCC) Managed by nephrologist, Dr. Rachele.  No new concerns discussed. Recent ultrasound consistent with medical renal disease  -Continue current management plan with nephrologist. Normocytic anemia Patient has mild normocytic anemia without symptoms Likely mild iron deficiency or due to anemia of chronic kidney disease  -Will obtain labs including iron panel, ferritin, folate, vitamin B12 with next blood draw     Orders Placed This Encounter  Procedures   Ferritin    Standing Status:   Future    Expected Date:   07/08/2024    Expiration Date:   10/06/2024   Folate    Standing Status:   Future    Expected Date:   07/08/2024    Expiration Date:   10/06/2024   Vitamin B12    Standing Status:   Future    Expected Date:   07/08/2024    Expiration Date:   10/06/2024   CBC with Differential/Platelet    Standing Status:   Future    Expected Date:   07/08/2024    Expiration Date:   10/06/2024    Comprehensive metabolic panel with GFR    Standing Status:   Future    Expected Date:   07/08/2024    Expiration Date:   10/06/2024   Iron and TIBC    Standing Status:   Future    Expected Date:   07/08/2024    Expiration Date:   10/06/2024   Multiple Myeloma Panel (SPEP&IFE w/QIG)    Standing Status:   Future    Expected Date:   07/08/2024    Expiration Date:   10/06/2024   Kappa/lambda light chains    Standing Status:   Future    Expected Date:   07/08/2024    Expiration Date:   10/06/2024    The total time spent in the appointment was 15 minutes encounter with patients including review of chart and various tests results, discussions about plan of care and coordination of care plan   All questions were answered. The patient knows to call the clinic with any problems, questions or concerns. No barriers to learning was detected.  Mickiel Dry, MD 7/30/20252:24 PM    INTERVAL HISTORY: SAYLOR MURRY 83 y.o. female following for abnormal biclonal M spike.She reports feeling 'pretty good' and denies any current complaints.  She follows with Dr. Rachele for chronic kidney disease.  I have reviewed the past medical history, past surgical history, social history and family history with the patient  ALLERGIES:  is allergic to penicillins.  MEDICATIONS:  Current Outpatient Medications  Medication Sig Dispense Refill   allopurinol (ZYLOPRIM) 300 MG tablet Take 300 mg by mouth daily.     amLODipine (NORVASC) 5 MG tablet Take 5 mg by mouth daily.     atorvastatin (LIPITOR) 20 MG tablet Take 20 mg by mouth daily.     cetirizine (ZYRTEC) 10 MG tablet      chlorthalidone (HYGROTON) 25 MG tablet Take 25 mg by mouth daily.     clopidogrel (PLAVIX) 75 MG tablet Take 75 mg by mouth daily.     ELIQUIS 5 MG TABS tablet Take 1 tablet by mouth 2 (two) times daily.     fluticasone (FLONASE) 50 MCG/ACT nasal spray      FML FORTE 0.25 % ophthalmic suspension      furosemide (LASIX) 20 MG tablet Take 20 mg  by mouth daily as needed.     latanoprost (XALATAN) 0.005 % ophthalmic solution 1 drop at bedtime.     LUMIGAN 0.01 % SOLN      meloxicam (MOBIC) 15 MG tablet      metoprolol (LOPRESSOR) 100 MG tablet      potassium chloride  SA (KLOR-CON  M) 20 MEQ tablet Take 1 tablet (20 mEq total) by mouth daily. 4 tablet O   pravastatin (PRAVACHOL) 40 MG tablet      silver sulfADIAZINE (SILVADENE) 1 % cream Apply topically.     traMADol -acetaminophen  (ULTRACET ) 37.5-325 MG tablet Take 1-2 tablets by mouth every 4 (four) hours as needed. 40 tablet 5   triamcinolone (NASACORT ALLERGY 24HR) 55 MCG/ACT AERO nasal inhaler Place into the nose.     Vitamin D, Ergocalciferol, (DRISDOL) 1.25 MG (50000 UNIT) CAPS capsule Take by mouth.     No current facility-administered medications for this visit.     REVIEW OF SYSTEMS:   Constitutional: Denies fevers, chills or night sweats Eyes: Denies blurriness of vision Ears, nose, mouth, throat, and face: Denies mucositis or sore throat Respiratory: Denies cough, dyspnea or wheezes Cardiovascular: Denies palpitation, chest discomfort or lower extremity swelling Gastrointestinal:  Denies nausea, heartburn or change in bowel habits Skin: Denies abnormal skin rashes Lymphatics: Denies new lymphadenopathy or easy bruising Neurological:Denies numbness, tingling or new weaknesses Behavioral/Psych: Mood is stable, no new changes  All other systems were reviewed with the patient and are negative.  PHYSICAL EXAMINATION:   Vitals:   01/03/24 1405  BP: (!) 168/86  Pulse: 75  Temp: 98 F (36.7 C)  SpO2: 99%    GENERAL:alert, no distress and comfortable SKIN: skin color, texture, turgor are normal, no rashes or significant lesions LUNGS: clear to auscultation and percussion with normal breathing effort HEART: regular rate & rhythm and no murmurs and no lower extremity edema ABDOMEN:abdomen soft, non-tender and normal bowel sounds Musculoskeletal:no cyanosis of digits  and no clubbing  NEURO: alert & oriented x 3 with fluent speech  LABORATORY DATA:  I have reviewed the data as listed  Lab Results  Component Value Date   WBC 6.6 12/27/2023   NEUTROABS 4.2 12/27/2023   HGB 11.2 (L) 12/27/2023   HCT 35.4 (L) 12/27/2023   MCV 95.4 12/27/2023   PLT 195 12/27/2023      Component Value Date/Time   NA 133 (L) 12/27/2023 1254   K 3.3 (L) 12/27/2023 1254   CL 94 (L) 12/27/2023 1254   CO2 27 12/27/2023 1254   GLUCOSE 122 (H) 12/27/2023 1254   BUN 32 (H) 12/27/2023 1254  CREATININE 1.74 (H) 12/27/2023 1254   CALCIUM 9.3 12/27/2023 1254   PROT 7.9 12/27/2023 1254   ALBUMIN 4.1 12/27/2023 1254   AST 36 12/27/2023 1254   ALT 40 12/27/2023 1254   ALKPHOS 127 (H) 12/27/2023 1254   BILITOT 1.0 12/27/2023 1254   GFRNONAA 29 (L) 12/27/2023 1254      Chemistry      Component Value Date/Time   NA 133 (L) 12/27/2023 1254   K 3.3 (L) 12/27/2023 1254   CL 94 (L) 12/27/2023 1254   CO2 27 12/27/2023 1254   BUN 32 (H) 12/27/2023 1254   CREATININE 1.74 (H) 12/27/2023 1254      Component Value Date/Time   CALCIUM 9.3 12/27/2023 1254   ALKPHOS 127 (H) 12/27/2023 1254   AST 36 12/27/2023 1254   ALT 40 12/27/2023 1254   BILITOT 1.0 12/27/2023 1254      Latest Reference Range & Units 12/27/23 12:54  Total Protein ELP 6.0 - 8.5 g/dL 7.4 (C)  Albumin SerPl Elph-Mcnc 2.9 - 4.4 g/dL 3.9 (C)  Albumin/Glob SerPl 0.7 - 1.7  1.2 (C)  Alpha2 Glob SerPl Elph-Mcnc 0.4 - 1.0 g/dL 1.0 (C)  Alpha 1 0.0 - 0.4 g/dL 0.3 (C)  Gamma Glob SerPl Elph-Mcnc 0.4 - 1.8 g/dL 1.2 (C)  M Protein SerPl Elph-Mcnc Not Observed g/dL 0.5 (H) (C)  IFE 1  Comment ! (C)  Globulin, Total 2.2 - 3.9 g/dL 3.5 (C)  B-Globulin SerPl Elph-Mcnc 0.7 - 1.3 g/dL 1.1 (C)  IgG (Immunoglobin G), Serum 586 - 1,602 mg/dL 413 - 8,397 mg/dL 8,732 8,781  IgM (Immunoglobulin M), Srm 26 - 217 mg/dL 26 - 782 mg/dL 56 51  IgA 64 - 577 mg/dL 64 - 577 mg/dL 80 81  (H): Data is abnormally high !: Data  is abnormal (C): Corrected   Latest Reference Range & Units 12/27/23 12:54  Kappa free light chain 3.3 - 19.4 mg/L 35.1 (H)  Lambda free light chains 5.7 - 26.3 mg/L 85.4 (H)  Kappa, lambda light chain ratio 0.26 - 1.65  0.41  (H): Data is abnormally high  RADIOLOGICAL IMAGES:  US  RENAL CLINICAL DATA:  Chronic renal disease  EXAM: RENAL / URINARY TRACT ULTRASOUND COMPLETE  COMPARISON:  02/14/2023  FINDINGS: Right Kidney:  Renal measurements: 8.6 x 4.1 x 3.5 cm. = volume: 65 mL. Diffuse increased echogenicity is noted. Fullness of the collecting system and proximal ureter is noted. No definitive calcification is seen.  Left Kidney:  Renal measurements: 11.0 x 5.5 x 4.3 cm. = volume: 138 mL. Increased echogenicity is noted. No mass lesion or hydronephrosis is noted. Simple cysts are seen similar to that seen on prior ultrasound examination. No follow-up is recommended.  Bladder:  Bladder is partially distended.  Other:  None.  IMPRESSION: Increased echogenicity consistent with chronic medical renal disease.  Fullness of the right renal collecting system and proximal ureter is noted. CT urogram may be helpful for further evaluation.  These results will be called to the ordering clinician or representative by the Radiologist Assistant, and communication documented in the PACS or [employer].  Electronically Signed   By: Oneil Devonshire M.D.   On: 01/02/2024 21:33

## 2024-03-18 ENCOUNTER — Encounter: Payer: Self-pay | Admitting: Urology

## 2024-03-18 ENCOUNTER — Ambulatory Visit (HOSPITAL_COMMUNITY)
Admission: RE | Admit: 2024-03-18 | Discharge: 2024-03-18 | Disposition: A | Discharge status: Home / Self Care | Source: Ambulatory Visit | Attending: Urology | Admitting: Urology

## 2024-03-18 ENCOUNTER — Ambulatory Visit: Admitting: Urology

## 2024-03-18 VITALS — BP 126/64 | HR 56

## 2024-03-18 DIAGNOSIS — N1330 Unspecified hydronephrosis: Secondary | ICD-10-CM

## 2024-03-18 DIAGNOSIS — N139 Obstructive and reflux uropathy, unspecified: Secondary | ICD-10-CM

## 2024-03-18 LAB — URINALYSIS, ROUTINE W REFLEX MICROSCOPIC
Bilirubin, UA: NEGATIVE
Glucose, UA: NEGATIVE
Ketones, UA: NEGATIVE
Leukocytes,UA: NEGATIVE
Nitrite, UA: NEGATIVE
RBC, UA: NEGATIVE
Specific Gravity, UA: 1.015 (ref 1.005–1.030)
Urobilinogen, Ur: 1 mg/dL (ref 0.2–1.0)
pH, UA: 6.5 (ref 5.0–7.5)

## 2024-03-18 NOTE — Progress Notes (Signed)
 03/18/2024 12:05 PM   Katherine SHAUNNA Hanson 01/02/1941 [id number]  Referring provider: Delsie Riggs, NP [address] [place],  [place] [address]  Right hydronephrosis    HPI: [name] is a 83yo here for evaluation of right hydronephrosis. She underwent renal US  12/2023 which showed mild right hydronephrosis. Renal US  from previous year did not show hydronephrosis. No hx of nephrolithiasis. No flank pain.    PMH: Past Medical History:  Diagnosis Date   Arthritis    Hay fever    High cholesterol    HTN (hypertension)    Seasonal allergies     Surgical History: Past Surgical History:  Procedure Laterality Date   ANKLE SURGERY  metal   2008 Dr. Daris right OTIF   cyst removed  breast   TONSILLECTOMY     TOTAL ABDOMINAL HYSTERECTOMY     TOTAL KNEE ARTHROPLASTY  left   Dr. Margrette    Home Medications:  Allergies as of 03/18/2024       Reactions   Penicillins         Medication List        Accurate as of March 18, 2024 12:05 PM. If you have any questions, ask your nurse or doctor.          allopurinol 300 MG tablet Commonly known as: ZYLOPRIM Take 300 mg by mouth daily.   amLODipine 5 MG tablet Commonly known as: NORVASC Take 5 mg by mouth daily.   atorvastatin 20 MG tablet Commonly known as: LIPITOR Take 20 mg by mouth daily.   atorvastatin 40 MG tablet Commonly known as: LIPITOR Take 20 mg by mouth daily.   cetirizine 10 MG tablet Commonly known as: ZYRTEC   chlorthalidone 25 MG tablet Commonly known as: HYGROTON Take 25 mg by mouth daily.   clopidogrel 75 MG tablet Commonly known as: PLAVIX Take 75 mg by mouth daily.   Eliquis 5 MG Tabs tablet Generic drug: apixaban Take 1 tablet by mouth 2 (two) times daily.   fluticasone 50 MCG/ACT nasal spray Commonly known as: FLONASE   FML Forte 0.25 % ophthalmic suspension Generic drug: fluorometholone   furosemide 20 MG tablet Commonly known as: LASIX Take 20 mg by mouth  daily as needed.   JARDIANCE PO Take 5 mg by mouth daily.   latanoprost 0.005 % ophthalmic solution Commonly known as: XALATAN 1 drop at bedtime.   losartan 25 MG tablet Commonly known as: COZAAR Take 25 mg by mouth daily.   Lumigan 0.01 % Soln Generic drug: bimatoprost   meloxicam 15 MG tablet Commonly known as: MOBIC   metoprolol tartrate 100 MG tablet Commonly known as: LOPRESSOR   Nasacort Allergy 24HR 55 MCG/ACT Aero nasal inhaler Generic drug: triamcinolone Place into the nose.   potassium chloride  SA 20 MEQ tablet Commonly known as: KLOR-CON  M Take 1 tablet (20 mEq total) by mouth daily.   pravastatin 40 MG tablet Commonly known as: PRAVACHOL   silver sulfADIAZINE 1 % cream Commonly known as: SILVADENE Apply topically.   traMADol -acetaminophen  37.5-325 MG tablet Commonly known as: Ultracet  Take 1-2 tablets by mouth every 4 (four) hours as needed.   Vitamin D (Ergocalciferol) 1.25 MG (50000 UNIT) Caps capsule Commonly known as: DRISDOL Take by mouth.        Allergies:  Allergies  Allergen Reactions   Penicillins     Family History: Family History  Problem Relation Age of Onset   Arthritis Unknown    Hypertension Mother    Glaucoma  Mother     Social History:  reports that she has never smoked. She has never used smokeless tobacco. She reports that she does not drink alcohol and does not use drugs.  ROS: All other review of systems were reviewed and are negative except what is noted above in HPI  Physical Exam: BP 126/64   Pulse (!) 56   Constitutional:  Alert and oriented, No acute distress. HEENT: [place] AT, moist mucus membranes.  Trachea midline, no masses. Cardiovascular: No clubbing, cyanosis, or edema. Respiratory: Normal respiratory effort, no increased work of breathing. GI: Abdomen is soft, nontender, nondistended, no abdominal masses GU: No CVA tenderness.  Lymph: No cervical or inguinal lymphadenopathy. Skin: No rashes, bruises  or suspicious lesions. Neurologic: Grossly intact, no focal deficits, moving all 4 extremities. Psychiatric: Normal mood and affect.  Laboratory Data: Lab Results  Component Value Date   WBC 6.6 12/27/2023   HGB 11.2 (L) 12/27/2023   HCT 35.4 (L) 12/27/2023   MCV 95.4 12/27/2023   PLT 195 12/27/2023    Lab Results  Component Value Date   CREATININE 1.74 (H) 12/27/2023    No results found for: PSA  No results found for: TESTOSTERONE  No results found for: HGBA1C  Urinalysis No results found for: COLORURINE, APPEARANCEUR, LABSPEC, PHURINE, GLUCOSEU, HGBUR, BILIRUBINUR, KETONESUR, PROTEINUR, UROBILINOGEN, NITRITE, LEUKOCYTESUR  No results found for: LABMICR, WBCUA, RBCUA, LABEPIT, MUCUS, BACTERIA  Pertinent Imaging: Renal US  12/27/23: Images reviewed and discussed with the patient  No results found for this or any previous visit.  No results found for this or any previous visit.  No results found for this or any previous visit.  No results found for this or any previous visit.  Results for orders placed during the hospital encounter of 12/27/23  US  RENAL  Narrative CLINICAL DATA:  Chronic renal disease  EXAM: RENAL / URINARY TRACT ULTRASOUND COMPLETE  COMPARISON:  02/14/2023  FINDINGS: Right Kidney:  Renal measurements: 8.6 x 4.1 x 3.5 cm. = volume: 65 mL. Diffuse increased echogenicity is noted. Fullness of the collecting system and proximal ureter is noted. No definitive calcification is seen.  Left Kidney:  Renal measurements: 11.0 x 5.5 x 4.3 cm. = volume: 138 mL. Increased echogenicity is noted. No mass lesion or hydronephrosis is noted. Simple cysts are seen similar to that seen on prior ultrasound examination. No follow-up is recommended.  Bladder:  Bladder is partially distended.  Other:  None.  IMPRESSION: Increased echogenicity consistent with chronic medical renal disease.  Fullness of the  right renal collecting system and proximal ureter is noted. CT urogram may be helpful for further evaluation.  These results will be called to the ordering clinician or representative by the Radiologist Assistant, and communication documented in the PACS or [employer].   Electronically Signed By: Oneil Devonshire M.D. On: 01/02/2024 21:33  No results found for this or any previous visit.  No results found for this or any previous visit.  No results found for this or any previous visit.   Assessment & Plan:    1. Hydronephrosis, unspecified hydronephrosis type CT stone study, will call with results.   No follow-ups on file.  Belvie Clara, MD  [facility]

## 2024-03-18 NOTE — Addendum Note (Signed)
 Addended by: Sharnette Kitamura L on: 03/18/2024 12:13 PM   Modules accepted: Orders

## 2024-03-18 NOTE — Patient Instructions (Signed)
 Hydronephrosis  Hydronephrosis is the swelling of one or both kidneys due to a blockage that stops urine from flowing out of the body. Kidneys filter waste from the blood and produce urine. This condition can lead to kidney failure and may become life-threatening if not treated promptly. What are the causes? In infants and children, common causes include problems that occur when a baby is developing in the womb. These can include problems in the kidneys or in the tubes that drain urine into the bladder (ureters). In adults, common causes include: Kidney stones. Pregnancy. A tumor or cyst in the abdomen or pelvis. An enlarged prostate gland. Other causes include: Bladder infection. Scar tissue from a previous surgery or injury. A blood clot. Cancer of the prostate, bladder, uterus, ovary, or colon. What are the signs or symptoms? Symptoms of this condition include: Pain or discomfort in your side (flank) or abdomen. Swelling in your abdomen. Nausea and vomiting. Fever. Pain when passing urine. Feelings of urgency when you need to urinate. Urinating more often than normal. In some cases, you may not have any symptoms. How is this diagnosed? This condition may be diagnosed based on: Your symptoms and medical history. A physical exam. Blood and urine tests. Imaging tests, such as an ultrasound, CT scan, or MRI. A procedure to look at your urinary tract and bladder by inserting a scope into the urethra (cystoscopy). How is this treated? Treatment for this condition depends on where the blockage is, how long it has been there, and what caused it. The goal of treatment is to remove the blockage. Treatment may include: Antibiotic medicines to treat or prevent infection. A procedure to place a small, thin tube (stent) into a blocked ureter. The stent will keep the ureter open so that urine can drain through it. A nonsurgical procedure that crushes kidney stones with shock waves  (extracorporeal shock wave lithotripsy). If kidney failure occurs, treatment may include dialysis or a kidney transplant. Follow these instructions at home:  Take over-the-counter and prescription medicines only as told by your health care provider. If you were prescribed an antibiotic medicine, take it exactly as told by your health care provider. Do not stop taking the antibiotic even if you start to feel better. Rest and return to your normal activities as told by your health care provider. Ask your health care provider what activities are safe for you. Drink enough fluid to keep your urine pale yellow. Keep all follow-up visits. This is important. Contact a health care provider if: You continue to have symptoms after treatment. You develop new symptoms. Your urine becomes cloudy or bloody. You have a fever. Get help right away if: You have severe flank or abdominal pain. You cannot drink fluids without vomiting. Summary Hydronephrosis is the swelling of one or both kidneys due to a blockage that stops urine from flowing out of the body. Hydronephrosis can lead to kidney failure and may become life-threatening if not treated promptly. The goal of treatment is to remove the blockage. It may include a procedure to insert a stent into a blocked ureter, a procedure to break up kidney stones, or taking antibiotic medicines. Follow your health care provider's instructions for taking care of yourself at home, including instructions about drinking fluids, taking medicines, and limiting activities. This information is not intended to replace advice given to you by your health care provider. Make sure you discuss any questions you have with your health care provider. Document Revised: 02/21/2023 Document Reviewed: 02/21/2023 Elsevier  Patient Education  2024 [employer].

## 2024-03-26 ENCOUNTER — Ambulatory Visit: Payer: Self-pay

## 2024-04-04 ENCOUNTER — Other Ambulatory Visit (HOSPITAL_COMMUNITY): Payer: Self-pay | Admitting: Nephrology

## 2024-04-04 DIAGNOSIS — N184 Chronic kidney disease, stage 4 (severe): Secondary | ICD-10-CM

## 2024-04-11 ENCOUNTER — Emergency Department (HOSPITAL_COMMUNITY)
Admission: EM | Admit: 2024-04-11 | Discharge: 2024-04-11 | Disposition: A | Discharge status: Home / Self Care | Attending: Emergency Medicine | Admitting: Emergency Medicine

## 2024-04-11 ENCOUNTER — Emergency Department (HOSPITAL_COMMUNITY)

## 2024-04-11 ENCOUNTER — Encounter (HOSPITAL_COMMUNITY): Payer: Self-pay | Admitting: *Deleted

## 2024-04-11 ENCOUNTER — Other Ambulatory Visit: Payer: Self-pay

## 2024-04-11 DIAGNOSIS — R6889 Other general symptoms and signs: Secondary | ICD-10-CM

## 2024-04-11 DIAGNOSIS — R0981 Nasal congestion: Secondary | ICD-10-CM | POA: Insufficient documentation

## 2024-04-11 DIAGNOSIS — R7989 Other specified abnormal findings of blood chemistry: Secondary | ICD-10-CM | POA: Diagnosis not present

## 2024-04-11 DIAGNOSIS — R531 Weakness: Secondary | ICD-10-CM | POA: Insufficient documentation

## 2024-04-11 DIAGNOSIS — R059 Cough, unspecified: Secondary | ICD-10-CM | POA: Diagnosis present

## 2024-04-11 DIAGNOSIS — I517 Cardiomegaly: Secondary | ICD-10-CM | POA: Insufficient documentation

## 2024-04-11 DIAGNOSIS — Z7901 Long term (current) use of anticoagulants: Secondary | ICD-10-CM | POA: Diagnosis not present

## 2024-04-11 DIAGNOSIS — N189 Chronic kidney disease, unspecified: Secondary | ICD-10-CM | POA: Insufficient documentation

## 2024-04-11 DIAGNOSIS — R0602 Shortness of breath: Secondary | ICD-10-CM | POA: Diagnosis present

## 2024-04-11 DIAGNOSIS — R609 Edema, unspecified: Secondary | ICD-10-CM | POA: Diagnosis not present

## 2024-04-11 DIAGNOSIS — N281 Cyst of kidney, acquired: Secondary | ICD-10-CM | POA: Insufficient documentation

## 2024-04-11 DIAGNOSIS — N289 Disorder of kidney and ureter, unspecified: Secondary | ICD-10-CM | POA: Insufficient documentation

## 2024-04-11 LAB — COMPREHENSIVE METABOLIC PANEL WITH GFR
ALT: 27 U/L (ref 0–44)
AST: 35 U/L (ref 15–41)
Albumin: 4.6 g/dL (ref 3.5–5.0)
Alkaline Phosphatase: 137 U/L — ABNORMAL HIGH (ref 38–126)
Anion gap: 15 (ref 5–15)
BUN: 39 mg/dL — ABNORMAL HIGH (ref 8–23)
CO2: 26 mmol/L (ref 22–32)
Calcium: 9.7 mg/dL (ref 8.9–10.3)
Chloride: 98 mmol/L (ref 98–111)
Creatinine, Ser: 2.11 mg/dL — ABNORMAL HIGH (ref 0.44–1.00)
GFR, Estimated: 23 mL/min — ABNORMAL LOW (ref >60)
Glucose, Bld: 117 mg/dL — ABNORMAL HIGH (ref 70–99)
Potassium: 3 mmol/L — ABNORMAL LOW (ref 3.5–5.1)
Sodium: 139 mmol/L (ref 135–145)
Total Bilirubin: 0.9 mg/dL (ref 0.0–1.2)
Total Protein: 8.1 g/dL (ref 6.5–8.1)

## 2024-04-11 LAB — RESP PANEL BY RT-PCR (RSV, FLU A&B, COVID)  RVPGX2
Influenza A by PCR: NEGATIVE
Influenza B by PCR: NEGATIVE
Resp Syncytial Virus by PCR: NEGATIVE
SARS Coronavirus 2 by RT PCR: NEGATIVE

## 2024-04-11 LAB — CBC
HCT: 35.7 % — ABNORMAL LOW (ref 36.0–46.0)
Hemoglobin: 11.4 g/dL — ABNORMAL LOW (ref 12.0–15.0)
MCH: 29.5 pg (ref 26.0–34.0)
MCHC: 31.9 g/dL (ref 30.0–36.0)
MCV: 92.5 fL (ref 80.0–100.0)
Platelets: 169 K/uL (ref 150–400)
RBC: 3.86 MIL/uL — ABNORMAL LOW (ref 3.87–5.11)
RDW: 14.8 % (ref 11.5–15.5)
WBC: 7.8 K/uL (ref 4.0–10.5)
nRBC: 0 % (ref 0.0–0.2)

## 2024-04-11 LAB — PRO BRAIN NATRIURETIC PEPTIDE: Pro Brain Natriuretic Peptide: 2887 pg/mL — ABNORMAL HIGH (ref <300.0)

## 2024-04-11 MED ORDER — FUROSEMIDE 20 MG PO TABS: 20.0000 mg | ORAL_TABLET | Freq: Every day | ORAL | 0 refills | Status: DC

## 2024-04-11 MED ORDER — AZITHROMYCIN 250 MG PO TABS: 250.0000 mg | ORAL_TABLET | Freq: Every day | ORAL | 0 refills | Status: AC

## 2024-04-11 MED ORDER — POTASSIUM CHLORIDE CRYS ER 20 MEQ PO TBCR
40.0000 meq | EXTENDED_RELEASE_TABLET | Freq: Once | ORAL | Status: AC
  Administered 2024-04-11: 40 meq via ORAL
  Filled 2024-04-11: qty 2

## 2024-04-11 MED ORDER — FUROSEMIDE 40 MG PO TABS: 20.0000 mg | ORAL_TABLET | Freq: Once | ORAL | Status: DC

## 2024-04-11 MED ORDER — AZITHROMYCIN 250 MG PO TABS
500.0000 mg | ORAL_TABLET | Freq: Once | ORAL | Status: AC
  Administered 2024-04-11: 500 mg via ORAL
  Filled 2024-04-11: qty 2

## 2024-04-11 MED ORDER — FUROSEMIDE 10 MG/ML IJ SOLN
20.0000 mg | Freq: Once | INTRAMUSCULAR | Status: AC
  Administered 2024-04-11: 20 mg via INTRAVENOUS
  Filled 2024-04-11: qty 2

## 2024-04-11 NOTE — ED Provider Notes (Signed)
 [place] EMERGENCY DEPARTMENT AT [facility] Provider Note   CSN: [id number] Arrival date & time: 04/11/24  9078     Patient presents with: Weakness   Katherine Hanson is a 83 y.o. female.   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  No focal pain, no focal weakness, no fever, no vomiting. She notes that last week she met with her nephrologist, and had multiple medications changed.     Prior to Admission medications   Medication Sig Start Date End Date Taking? Authorizing Provider  allopurinol (ZYLOPRIM) 300 MG tablet Take 300 mg by mouth daily.   Yes [provider]  amLODipine (NORVASC) 10 MG tablet Take 10 mg by mouth daily. 01/27/24  Yes [provider]  atorvastatin (LIPITOR) 40 MG tablet Take 20 mg by mouth daily. 02/29/24  Yes [provider]  azithromycin (ZITHROMAX) 250 MG tablet Take 1 tablet (250 mg total) by mouth daily for 4 days. Take 1 every day until finished. 04/11/24 04/15/24 Yes Garrick Charleston, MD  ELIQUIS 5 MG TABS tablet Take 1 tablet by mouth 2 (two) times daily. 03/16/23  Yes [provider]  fluticasone OREN) 50 MCG/ACT nasal spray  11/16/10  Yes [provider]  furosemide (LASIX) 20 MG tablet Take 20 mg by mouth daily as needed for fluid.   Yes [provider]  hydrALAZINE (APRESOLINE) 50 MG tablet Take 50 mg by mouth 2 (two) times daily. 04/04/24  Yes [provider]  LUMIGAN 0.01 % SOLN Place 1 drop into both eyes at bedtime. 01/30/18  Yes [provider]  metoprolol tartrate (LOPRESSOR) 100 MG tablet Take 100 mg by mouth 2 (two) times daily. 03/07/24  Yes [provider]  triamcinolone (NASACORT ALLERGY 24HR) 55 MCG/ACT AERO nasal inhaler Place into the nose. 07/20/22  Yes [provider]  triamcinolone cream (KENALOG) 0.1 % Apply 1 Application topically 3 (three) times daily. 02/29/24  Yes  [provider]  Vitamin D, Ergocalciferol, (DRISDOL) 1.25 MG (50000 UNIT) CAPS capsule Take by mouth. 03/07/23  Yes [provider]  latanoprost (XALATAN) 0.005 % ophthalmic solution 1 drop at bedtime. Patient not taking: Reported on 04/11/2024 12/07/22   [provider]  losartan (COZAAR) 25 MG tablet Take 25 mg by mouth daily. Patient not taking: Reported on 04/11/2024 01/26/24   [provider]    Allergies: Penicillins    Review of Systems  Updated Vital Signs BP (!) 158/76   Pulse 81   Temp 98.6 F (37 C) (Oral)   Resp 12   Ht 1.6 m (5' 3)   Wt 115.7 kg   SpO2 97%   BMI 45.17 kg/m   Physical Exam Vitals and nursing note reviewed.  Constitutional:      General: She is not in acute distress.    Appearance: She is well-developed.  HENT:     Head: Normocephalic and atraumatic.  Eyes:     Conjunctiva/sclera: Conjunctivae normal.  Cardiovascular:     Rate and Rhythm: Normal rate and regular rhythm.  Pulmonary:     Effort: Pulmonary effort is normal. No respiratory distress.     Breath sounds: Normal breath sounds. No stridor.  Abdominal:     General: There is no distension.  Skin:    General: Skin is warm and dry.  Neurological:     Mental Status: She is alert and oriented to person, place, and time.  Cranial Nerves: No cranial nerve deficit.  Psychiatric:        Mood and Affect: Mood normal.     (all labs ordered are listed, but only abnormal results are displayed) Labs Reviewed  COMPREHENSIVE METABOLIC PANEL WITH GFR - Abnormal; Notable for the following components:      Result Value   Potassium 3.0 (*)    Glucose, Bld 117 (*)    BUN 39 (*)    Creatinine, Ser 2.11 (*)    Alkaline Phosphatase 137 (*)    GFR, Estimated 23 (*)    All other components within normal limits  CBC - Abnormal; Notable for the following components:   RBC 3.86 (*)    Hemoglobin 11.4 (*)    HCT 35.7 (*)    All other components within normal  limits  PRO BRAIN NATRIURETIC PEPTIDE - Abnormal; Notable for the following components:   Pro Brain Natriuretic Peptide 2,887.0 (*)    All other components within normal limits  RESP PANEL BY RT-PCR (RSV, FLU A&B, COVID)  RVPGX2    EKG: None  Radiology: US  Renal Result Date: 04/11/2024 CLINICAL DATA:  Chronic kidney disease. EXAM: RENAL / URINARY TRACT ULTRASOUND COMPLETE COMPARISON:  12/27/2023 FINDINGS: Exam somewhat limited due to patient body habitus. Right Kidney: Renal measurements: 9.5 x 3.7 x 4.3 cm = volume: 79 mL. Minimal renal cortical thinning. Echogenicity within normal limits. No mass or hydronephrosis visualized. Left Kidney: Renal measurements: 12.4 x 6.3 x 5.0 cm = volume: 204 ML. Echogenicity within normal limits. No mass or hydronephrosis visualized. 2.7 cm cyst over the upper pole and 2.1 cm cyst over the lower pole. Bladder: Appears normal for degree of bladder distention. Ureteral jets not visualized. Other: None. IMPRESSION: 1. Normal size kidneys without hydronephrosis. 2.  Mild right renal cortical thinning. 3. Two left renal cysts with the largest measuring 2.7 cm. Electronically Signed   By: Toribio Agreste M.D.   On: 04/11/2024 14:09   DG Chest Port 1 View Result Date: 04/11/2024 CLINICAL DATA:  Shortness of breath EXAM: PORTABLE CHEST 1 VIEW COMPARISON:  None Available. FINDINGS: Normal lung volumes. Mild interstitial opacities. Patchy left basilar opacity. No pleural effusion or pneumothorax. Enlarged cardiomediastinal silhouette. Irregularity of the proximal left humeral head, likely degenerative. IMPRESSION: 1. Mild interstitial opacities, which may represent pulmonary edema. 2. Patchy left basilar opacity, which may represent atelectasis, aspiration, or pneumonia. 3. Cardiomegaly. Electronically Signed   By: Limin  Xu M.D.   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 Broad differential including medication associated symptoms, pneumonia, COVID, bacteremia, sepsis, electrolyte disturbances, worsening renal failure. Cardiac 65 sinus normal pulse ox 98% room air normal  Amount and/or Complexity of Data Reviewed External Data Reviewed: notes. Labs: ordered. Decision-making details documented in ED Course. Radiology: ordered and independent interpretation performed. Decision-making details documented in ED Course. ECG/medicine tests: ordered and independent interpretation performed. Decision-making details documented in ED  Course.  Risk Prescription drug management.   3:01 PM Patient febrile on repeat exam, no increased work of breathing, no supplemental oxygen, no hemodynamic instability.  I reviewed the labs, x-ray, discussed them with her.  She now off her recall that Lasix was one of the medications that was stopped.  With evidence for fluid retention, though the patient does have persistent evidence for CKD, patient will restart Lasix, daily, pending outpatient primary care versus nephrology follow-up. While here, radiology made me aware that the patient was scheduled for outpatient ultrasound renal tomorrow, this was facilitated today.  This was reassuring no evidence for obstruction. Patient requests antibiotics for her congestion, though we discussed likelihood that this is secondary to fluid retention, patient was accommodated.    Final diagnoses:  Weakness  Renal dysfunction  Congestion of throat  Elevated brain natriuretic peptide (BNP) level    ED Discharge Orders          Ordered    azithromycin  (ZITHROMAX) 250 MG tablet  Daily        04/11/24 1459               Garrick Charleston, MD 04/11/24 1502

## 2024-04-11 NOTE — ED Triage Notes (Signed)
 Pt states she has been taken off a lot of her medications due to her kidney function and states I just don't feel well   When asked what does she mean she doesn't feel well, pt is unable to name specific symptoms other than she says she has a lot of phlegm in her throat and she has to wipe her nose

## 2024-04-11 NOTE — Discharge Instructions (Addendum)
 Today's evaluation has been generally reassuring.  There is evidence that your body is retaining fluid, likely due to accommodation of your heart and kidneys. Please take your Lasix 20 mg, daily for the next 7 days and discussed with your physician.  In addition, complete your course of antibiotics. Return here for concerning changes in your condition.

## 2024-04-12 ENCOUNTER — Encounter (HOSPITAL_COMMUNITY): Payer: Self-pay

## 2024-04-12 ENCOUNTER — Ambulatory Visit (HOSPITAL_COMMUNITY): Admission: RE | Admit: 2024-04-12 | Source: Ambulatory Visit

## 2024-05-06 ENCOUNTER — Encounter (HOSPITAL_COMMUNITY): Payer: Self-pay

## 2024-05-06 ENCOUNTER — Other Ambulatory Visit: Payer: Self-pay

## 2024-05-06 ENCOUNTER — Emergency Department (HOSPITAL_COMMUNITY)

## 2024-05-06 ENCOUNTER — Observation Stay (HOSPITAL_COMMUNITY)

## 2024-05-06 ENCOUNTER — Inpatient Hospital Stay (HOSPITAL_COMMUNITY)
Admission: EM | Admit: 2024-05-06 | Discharge: 2024-05-09 | DRG: 280 | Disposition: A | Attending: Hospitalist | Admitting: Hospitalist

## 2024-05-06 DIAGNOSIS — N184 Chronic kidney disease, stage 4 (severe): Secondary | ICD-10-CM | POA: Diagnosis present

## 2024-05-06 DIAGNOSIS — J810 Acute pulmonary edema: Principal | ICD-10-CM

## 2024-05-06 DIAGNOSIS — D472 Monoclonal gammopathy: Secondary | ICD-10-CM | POA: Diagnosis present

## 2024-05-06 DIAGNOSIS — J811 Chronic pulmonary edema: Secondary | ICD-10-CM | POA: Diagnosis present

## 2024-05-06 DIAGNOSIS — I5030 Unspecified diastolic (congestive) heart failure: Secondary | ICD-10-CM | POA: Insufficient documentation

## 2024-05-06 DIAGNOSIS — I5033 Acute on chronic diastolic (congestive) heart failure: Secondary | ICD-10-CM

## 2024-05-06 DIAGNOSIS — E876 Hypokalemia: Secondary | ICD-10-CM

## 2024-05-06 DIAGNOSIS — I509 Heart failure, unspecified: Secondary | ICD-10-CM

## 2024-05-06 DIAGNOSIS — R600 Localized edema: Secondary | ICD-10-CM

## 2024-05-06 DIAGNOSIS — I10 Essential (primary) hypertension: Secondary | ICD-10-CM | POA: Insufficient documentation

## 2024-05-06 DIAGNOSIS — R040 Epistaxis: Secondary | ICD-10-CM

## 2024-05-06 DIAGNOSIS — D649 Anemia, unspecified: Secondary | ICD-10-CM | POA: Diagnosis present

## 2024-05-06 HISTORY — DX: Heart failure, unspecified: I50.9

## 2024-05-06 HISTORY — DX: Mixed hyperlipidemia: E78.2

## 2024-05-06 HISTORY — DX: Disorder of arteries and arterioles, unspecified: I77.9

## 2024-05-06 HISTORY — DX: Unspecified diastolic (congestive) heart failure: I50.30

## 2024-05-06 HISTORY — DX: Chronic kidney disease, stage 3b: N18.32

## 2024-05-06 HISTORY — DX: Unspecified atrial fibrillation: I48.91

## 2024-05-06 HISTORY — DX: Atherosclerotic heart disease of native coronary artery without angina pectoris: I25.10

## 2024-05-06 LAB — CBC WITH DIFFERENTIAL/PLATELET
Abs Immature Granulocytes: 0.03 K/uL (ref 0.00–0.07)
Basophils Absolute: 0 K/uL (ref 0.0–0.1)
Basophils Relative: 0 %
Eosinophils Absolute: 0 K/uL (ref 0.0–0.5)
Eosinophils Relative: 0 %
HCT: 29.6 % — ABNORMAL LOW (ref 36.0–46.0)
Hemoglobin: 9.7 g/dL — ABNORMAL LOW (ref 12.0–15.0)
Immature Granulocytes: 0 %
Lymphocytes Relative: 10 %
Lymphs Abs: 0.8 K/uL (ref 0.7–4.0)
MCH: 30.3 pg (ref 26.0–34.0)
MCHC: 32.8 g/dL (ref 30.0–36.0)
MCV: 92.5 fL (ref 80.0–100.0)
Monocytes Absolute: 0.7 K/uL (ref 0.1–1.0)
Monocytes Relative: 9 %
Neutro Abs: 6.7 K/uL (ref 1.7–7.7)
Neutrophils Relative %: 81 %
Platelets: 163 K/uL (ref 150–400)
RBC: 3.2 MIL/uL — ABNORMAL LOW (ref 3.87–5.11)
RDW: 15.7 % — ABNORMAL HIGH (ref 11.5–15.5)
WBC: 8.3 K/uL (ref 4.0–10.5)
nRBC: 0 % (ref 0.0–0.2)

## 2024-05-06 LAB — MAGNESIUM: Magnesium: 2.5 mg/dL — ABNORMAL HIGH (ref 1.7–2.4)

## 2024-05-06 LAB — PROTIME-INR
INR: 1.5 — ABNORMAL HIGH (ref 0.8–1.2)
Prothrombin Time: 18.4 s — ABNORMAL HIGH (ref 11.4–15.2)

## 2024-05-06 LAB — BASIC METABOLIC PANEL WITH GFR
Anion gap: 14 (ref 5–15)
BUN: 53 mg/dL — ABNORMAL HIGH (ref 8–23)
CO2: 26 mmol/L (ref 22–32)
Calcium: 9.4 mg/dL (ref 8.9–10.3)
Chloride: 99 mmol/L (ref 98–111)
Creatinine, Ser: 2.06 mg/dL — ABNORMAL HIGH (ref 0.44–1.00)
GFR, Estimated: 24 mL/min — ABNORMAL LOW (ref >60)
Glucose, Bld: 155 mg/dL — ABNORMAL HIGH (ref 70–99)
Potassium: 4 mmol/L (ref 3.5–5.1)
Sodium: 139 mmol/L (ref 135–145)

## 2024-05-06 LAB — COMPREHENSIVE METABOLIC PANEL WITH GFR
ALT: 21 U/L (ref 0–44)
AST: 28 U/L (ref 15–41)
Albumin: 4.5 g/dL (ref 3.5–5.0)
Alkaline Phosphatase: 114 U/L (ref 38–126)
Anion gap: 18 — ABNORMAL HIGH (ref 5–15)
BUN: 53 mg/dL — ABNORMAL HIGH (ref 8–23)
CO2: 22 mmol/L (ref 22–32)
Calcium: 9.5 mg/dL (ref 8.9–10.3)
Chloride: 97 mmol/L — ABNORMAL LOW (ref 98–111)
Creatinine, Ser: 2.04 mg/dL — ABNORMAL HIGH (ref 0.44–1.00)
GFR, Estimated: 24 mL/min — ABNORMAL LOW (ref >60)
Glucose, Bld: 140 mg/dL — ABNORMAL HIGH (ref 70–99)
Potassium: 3 mmol/L — ABNORMAL LOW (ref 3.5–5.1)
Sodium: 137 mmol/L (ref 135–145)
Total Bilirubin: 1.2 mg/dL (ref 0.0–1.2)
Total Protein: 7.4 g/dL (ref 6.5–8.1)

## 2024-05-06 LAB — BLOOD GAS, ARTERIAL
Acid-Base Excess: 3.2 mmol/L — ABNORMAL HIGH (ref 0.0–2.0)
Bicarbonate: 27.9 mmol/L (ref 20.0–28.0)
Drawn by: 41977
O2 Saturation: 96.3 %
Patient temperature: 37.1
pCO2 arterial: 42 mmHg (ref 32–48)
pH, Arterial: 7.43 (ref 7.35–7.45)
pO2, Arterial: 74 mmHg — ABNORMAL LOW (ref 83–108)

## 2024-05-06 LAB — GLUCOSE, CAPILLARY
Glucose-Capillary: 165 mg/dL — ABNORMAL HIGH (ref 70–99)
Glucose-Capillary: 130 mg/dL — ABNORMAL HIGH (ref 70–99)

## 2024-05-06 LAB — HEPARIN LEVEL (UNFRACTIONATED): Heparin Unfractionated: >1.1 [IU]/mL — ABNORMAL HIGH (ref 0.30–0.70)

## 2024-05-06 LAB — RESP PANEL BY RT-PCR (RSV, FLU A&B, COVID)  RVPGX2
Influenza A by PCR: NEGATIVE
Influenza B by PCR: NEGATIVE
Resp Syncytial Virus by PCR: NEGATIVE
SARS Coronavirus 2 by RT PCR: NEGATIVE

## 2024-05-06 LAB — APTT
aPTT: 39 s — ABNORMAL HIGH (ref 24–36)
aPTT: 36 s (ref 24–36)

## 2024-05-06 LAB — TROPONIN T, HIGH SENSITIVITY
Troponin T High Sensitivity: 180 ng/L (ref 0–19)
Troponin T High Sensitivity: 242 ng/L (ref 0–19)

## 2024-05-06 LAB — MRSA NEXT GEN BY PCR, NASAL: MRSA by PCR Next Gen: NOT DETECTED

## 2024-05-06 LAB — PROCALCITONIN: Procalcitonin: <0.1 ng/mL

## 2024-05-06 LAB — PRO BRAIN NATRIURETIC PEPTIDE: Pro Brain Natriuretic Peptide: 4626 pg/mL — ABNORMAL HIGH (ref <300.0)

## 2024-05-06 MED ORDER — SALINE SPRAY 0.65 % NA SOLN: 1.0000 | NASAL | Status: DC | PRN

## 2024-05-06 MED ORDER — HEPARIN (PORCINE) 25000 UT/250ML-% IV SOLN
1100.0000 [IU]/h | INTRAVENOUS | Status: DC
  Administered 2024-05-06: 1100 [IU]/h via INTRAVENOUS
  Filled 2024-05-06: qty 250

## 2024-05-06 MED ORDER — ACETAMINOPHEN 650 MG RE SUPP: 650.0000 mg | Freq: Four times a day (QID) | RECTAL | Status: DC | PRN

## 2024-05-06 MED ORDER — POTASSIUM CHLORIDE CRYS ER 20 MEQ PO TBCR
40.0000 meq | EXTENDED_RELEASE_TABLET | Freq: Once | ORAL | Status: AC
  Administered 2024-05-06: 40 meq via ORAL
  Filled 2024-05-06: qty 2

## 2024-05-06 MED ORDER — FUROSEMIDE 10 MG/ML IJ SOLN
40.0000 mg | Freq: Two times a day (BID) | INTRAMUSCULAR | Status: DC
  Administered 2024-05-06: 40 mg via INTRAVENOUS
  Filled 2024-05-06: qty 4

## 2024-05-06 MED ORDER — PANTOPRAZOLE SODIUM 40 MG IV SOLR
40.0000 mg | Freq: Every day | INTRAVENOUS | Status: DC
  Administered 2024-05-06 – 2024-05-09 (×4): 40 mg via INTRAVENOUS
  Filled 2024-05-06 (×4): qty 10

## 2024-05-06 MED ORDER — LEVOFLOXACIN IN D5W 750 MG/150ML IV SOLN
750.0000 mg | INTRAVENOUS | Status: DC
  Administered 2024-05-06: 750 mg via INTRAVENOUS
  Filled 2024-05-06: qty 150

## 2024-05-06 MED ORDER — CHLORHEXIDINE GLUCONATE CLOTH 2 % EX PADS
6.0000 | MEDICATED_PAD | Freq: Every day | CUTANEOUS | Status: DC
  Administered 2024-05-06 – 2024-05-09 (×4): 6 via TOPICAL

## 2024-05-06 MED ORDER — POTASSIUM CHLORIDE 10 MEQ/100ML IV SOLN
10.0000 meq | INTRAVENOUS | Status: AC
  Administered 2024-05-06 (×2): 10 meq via INTRAVENOUS
  Filled 2024-05-06 (×2): qty 100

## 2024-05-06 MED ORDER — FUROSEMIDE 10 MG/ML IJ SOLN
80.0000 mg | Freq: Once | INTRAMUSCULAR | Status: AC
  Administered 2024-05-06: 80 mg via INTRAVENOUS
  Filled 2024-05-06: qty 8

## 2024-05-06 MED ORDER — FUROSEMIDE 10 MG/ML IJ SOLN
INTRAMUSCULAR | Status: AC
  Filled 2024-05-06: qty 20

## 2024-05-06 MED ORDER — IPRATROPIUM BROMIDE 0.02 % IN SOLN
0.5000 mg | Freq: Four times a day (QID) | RESPIRATORY_TRACT | Status: DC | PRN
  Administered 2024-05-06: 0.5 mg via RESPIRATORY_TRACT
  Filled 2024-05-06: qty 2.5

## 2024-05-06 MED ORDER — HEPARIN BOLUS VIA INFUSION
4000.0000 [IU] | Freq: Once | INTRAVENOUS | Status: AC
  Administered 2024-05-06: 4000 [IU] via INTRAVENOUS
  Filled 2024-05-06: qty 4000

## 2024-05-06 MED ORDER — METOPROLOL TARTRATE 5 MG/5ML IV SOLN
5.0000 mg | Freq: Once | INTRAVENOUS | Status: AC
  Administered 2024-05-06: 5 mg via INTRAVENOUS
  Filled 2024-05-06: qty 5

## 2024-05-06 MED ORDER — FUROSEMIDE 10 MG/ML IJ SOLN
8.0000 mg/h | INTRAVENOUS | Status: DC
  Administered 2024-05-06: 8 mg/h via INTRAVENOUS
  Administered 2024-05-07: 4 mg/h via INTRAVENOUS
  Filled 2024-05-06 (×2): qty 20

## 2024-05-06 MED ORDER — ACETAMINOPHEN 325 MG PO TABS: 650.0000 mg | ORAL_TABLET | Freq: Four times a day (QID) | ORAL | Status: DC | PRN

## 2024-05-06 MED ORDER — IPRATROPIUM-ALBUTEROL 0.5-2.5 (3) MG/3ML IN SOLN: 3.0000 mL | Freq: Once | RESPIRATORY_TRACT | Status: AC

## 2024-05-06 MED ORDER — OXYMETAZOLINE HCL 0.05 % NA SOLN: 2.0000 | Freq: Two times a day (BID) | NASAL | Status: DC | PRN

## 2024-05-06 MED ORDER — IPRATROPIUM BROMIDE 0.02 % IN SOLN: 0.5000 mg | Freq: Four times a day (QID) | RESPIRATORY_TRACT | Status: DC

## 2024-05-06 MED ORDER — MORPHINE SULFATE (PF) 2 MG/ML IV SOLN: 2.0000 mg | Freq: Once | INTRAVENOUS | Status: DC

## 2024-05-06 MED ORDER — LORAZEPAM 2 MG/ML IJ SOLN: 0.5000 mg | Freq: Once | INTRAMUSCULAR | Status: AC

## 2024-05-06 MED ORDER — SODIUM CHLORIDE 0.9 % IV SOLN: 1.0000 g | INTRAVENOUS | Status: DC

## 2024-05-06 MED ORDER — IPRATROPIUM-ALBUTEROL 0.5-2.5 (3) MG/3ML IN SOLN
RESPIRATORY_TRACT | Status: AC
  Administered 2024-05-06: 3 mL via RESPIRATORY_TRACT
  Filled 2024-05-06: qty 3

## 2024-05-06 MED ORDER — LORAZEPAM 2 MG/ML IJ SOLN
INTRAMUSCULAR | Status: AC
  Administered 2024-05-06: 0.5 mg via INTRAVENOUS
  Filled 2024-05-06: qty 1

## 2024-05-06 MED ORDER — POTASSIUM CHLORIDE CRYS ER 20 MEQ PO TBCR
40.0000 meq | EXTENDED_RELEASE_TABLET | ORAL | Status: AC
  Administered 2024-05-06 (×2): 40 meq via ORAL
  Filled 2024-05-06 (×2): qty 2

## 2024-05-06 MED ORDER — INSULIN ASPART 100 UNIT/ML IJ SOLN
0.0000 [IU] | Freq: Three times a day (TID) | INTRAMUSCULAR | Status: DC
  Administered 2024-05-06 – 2024-05-07 (×2): 3 [IU] via SUBCUTANEOUS
  Administered 2024-05-07 – 2024-05-09 (×3): 2 [IU] via SUBCUTANEOUS
  Filled 2024-05-06 (×5): qty 1

## 2024-05-06 NOTE — ED Notes (Signed)
 ED Provider at bedside.

## 2024-05-06 NOTE — ED Provider Notes (Signed)
 [place] EMERGENCY DEPARTMENT AT [facility] Provider Note   CSN: [id number] Arrival date & time: 05/06/24  9066     Patient presents with: Epistaxis   Katherine Hanson is a 83 y.o. female.    Epistaxis     Patient presents because epistaxis.  Started last night.  Both nose.  Last bled at 530 this a.m.  Currently not bleeding.  Does take Eliquis in setting of A-fib.  Also endorsing some slight shortness of breath especially when lying down.  According to daughter at bedside, patient has not been lie down flat for the past 2 to 3 years because of chronic shortness of breath.  Patient denies any chest pain.  No exertional shortness of breath or chest pain.  Patient states it feels like she has congestion in her nose at this point time.  Endorses some bilateral lower extremity swelling for her which is normal for her.  No pain.  No history of DVT or PE.  Not coughing up any blood or have any kind pleuritic chest pain.  Compliant with Eliquis.  Does not wear home oxygen.   Previous medical history reviewed : Last seen in the ED on November 16 and 25.  Was seen because of weakness.  Present with cough congestion.  Negative workup.  With [facility] cardiology.  History of HFpEF.  Hypertension hyperlipidemia.  Permanent A-fib.  On metoprolol to tartrate 100 mg twice daily as well as Eliquis 5 mg twice daily.  Carotid stenosis on clopidogrel 75 mg daily.  Also with oncology.  Monoclonal gammopathy of unknown significance.   Prior to Admission medications   Medication Sig Start Date End Date Taking? Authorizing Provider  allopurinol (ZYLOPRIM) 300 MG tablet Take 300 mg by mouth daily.    [provider]  amLODipine (NORVASC) 10 MG tablet Take 10 mg by mouth daily. 01/27/24   [provider]  atorvastatin (LIPITOR) 40 MG tablet Take 20 mg by mouth daily. 02/29/24   [provider]  ELIQUIS 5 MG TABS tablet Take 1 tablet by mouth 2 (two) times daily. 03/16/23    [provider]  fluticasone OREN) 50 MCG/ACT nasal spray  11/16/10   [provider]  furosemide  (LASIX ) 20 MG tablet Take 1 tablet (20 mg total) by mouth daily for 7 days. 04/11/24 04/18/24  Garrick Charleston, MD  hydrALAZINE (APRESOLINE) 50 MG tablet Take 50 mg by mouth 2 (two) times daily. 04/04/24   [provider]  latanoprost (XALATAN) 0.005 % ophthalmic solution 1 drop at bedtime. Patient not taking: Reported on 04/11/2024 12/07/22   [provider]  losartan (COZAAR) 25 MG tablet Take 25 mg by mouth daily. Patient not taking: Reported on 04/11/2024 01/26/24   [provider]  LUMIGAN 0.01 % SOLN Place 1 drop into both eyes at bedtime. 01/30/18   [provider]  metoprolol tartrate (LOPRESSOR) 100 MG tablet Take 100 mg by mouth 2 (two) times daily. 03/07/24   [provider]  triamcinolone (NASACORT ALLERGY 24HR) 55 MCG/ACT AERO nasal inhaler Place into the nose. 07/20/22   [provider]  triamcinolone cream (KENALOG) 0.1 % Apply 1 Application topically 3 (three) times daily. 02/29/24   [provider]  Vitamin D, Ergocalciferol, (DRISDOL) 1.25 MG (50000 UNIT) CAPS capsule Take by mouth. 03/07/23   [provider]    Allergies: Penicillins    Review of Systems  HENT:  Positive for nosebleeds.     Updated Vital Signs BP (!) 158/62  Pulse 84   Temp 98.9 F (37.2 C) (Oral)   Resp 15   Ht 5' 3 (1.6 m)   Wt 115.7 kg   SpO2 99%   BMI 45.17 kg/m   Physical Exam Vitals and nursing note reviewed.  Constitutional:      General: She is not in acute distress.    Appearance: She is well-developed.  HENT:     Head: Normocephalic and atraumatic.  Eyes:     Conjunctiva/sclera: Conjunctivae normal.  Cardiovascular:     Rate and Rhythm: Normal rate and regular rhythm.     Heart sounds: No murmur heard. Pulmonary:     Effort: Pulmonary effort is normal. No respiratory distress.     Breath sounds:  Normal breath sounds.  Abdominal:     Palpations: Abdomen is soft.     Tenderness: There is no abdominal tenderness.  Musculoskeletal:        General: No swelling.     Cervical back: Neck supple.     Right lower leg: Edema present.     Left lower leg: Edema present.  Skin:    General: Skin is warm and dry.     Capillary Refill: Capillary refill takes less than 2 seconds.  Neurological:     Mental Status: She is alert.  Psychiatric:        Mood and Affect: Mood normal.     (all labs ordered are listed, but only abnormal results are displayed) Labs Reviewed  PRO BRAIN NATRIURETIC PEPTIDE - Abnormal; Notable for the following components:      Result Value   Pro Brain Natriuretic Peptide 4,626.0 (*)    All other components within normal limits  COMPREHENSIVE METABOLIC PANEL WITH GFR - Abnormal; Notable for the following components:   Potassium 3.0 (*)    Chloride 97 (*)    Glucose, Bld 140 (*)    BUN 53 (*)    Creatinine, Ser 2.04 (*)    GFR, Estimated 24 (*)    Anion gap 18 (*)    All other components within normal limits  MAGNESIUM - Abnormal; Notable for the following components:   Magnesium 2.5 (*)    All other components within normal limits  PROTIME-INR - Abnormal; Notable for the following components:   Prothrombin Time 18.4 (*)    INR 1.5 (*)    All other components within normal limits  APTT - Abnormal; Notable for the following components:   aPTT 39 (*)    All other components within normal limits  CBC WITH DIFFERENTIAL/PLATELET - Abnormal; Notable for the following components:   RBC 3.20 (*)    Hemoglobin 9.7 (*)    HCT 29.6 (*)    RDW 15.7 (*)    All other components within normal limits  RESP PANEL BY RT-PCR (RSV, FLU A&B, COVID)  RVPGX2    EKG: EKG Interpretation Date/Time:  Monday May 06 2024 10:04:43 EST Ventricular Rate:  88 PR Interval:    QRS Duration:  103 QT Interval:  319 QTC Calculation: 386 R Axis:   92  Text  Interpretation: Atrial fibrillation Paired ventricular premature complexes Right axis deviation Low voltage, extremity leads Repol abnrm suggests ischemia, anterolateral Baseline wander in lead(s) I III aVL Confirmed by Simon Rea [phone]) on 05/06/2024 10:22:37 AM  Radiology: [facility] Chest Port 1 View Result Date: 05/06/2024 CLINICAL DATA:  Shortness of breath. EXAM: PORTABLE CHEST 1 VIEW COMPARISON:  04/11/2024. FINDINGS: Cardiomegaly, unchanged. Aortic atherosclerosis. Bilateral interstitial opacities, most pronounced at the  lung bases, slightly more pronounced compared to the prior exam. No pleural effusion or pneumothorax. No acute osseous abnormality. IMPRESSION: 1. Bilateral interstitial opacities are slightly more pronounced compared to the prior exam and may reflect interstitial pulmonary edema. 2. Cardiomegaly. Electronically Signed   By: Harrietta Sherry M.D.   On: 05/06/2024 11:21     Procedures   Medications Ordered in the ED  ipratropium-albuterol (DUONEB) 0.5-2.5 (3) MG/3ML nebulizer solution 3 mL (3 mLs Nebulization Given 05/06/24 1024)  potassium chloride  SA (KLOR-CON  M) CR tablet 40 mEq (40 mEq Oral Given 05/06/24 1326)                                    Medical Decision Making Amount and/or Complexity of Data Reviewed Labs: ordered. Radiology: ordered.  Risk Prescription drug management.     HPI:     Patient presents because epistaxis.  Started last night.  Both nose.  Last bled at 530 this a.m.  Currently not bleeding.  Does take Eliquis in setting of A-fib.  Also endorsing some slight shortness of breath especially when lying down.  According to daughter at bedside, patient has not been lie down flat for the past 2 to 3 years because of chronic shortness of breath.  Patient denies any chest pain.  No exertional shortness of breath or chest pain.  Patient states it feels like she has congestion in her nose at this point time.  Endorses some bilateral lower extremity  swelling for her which is normal for her.  No pain.  No history of DVT or PE.  Not coughing up any blood or have any kind pleuritic chest pain.  Compliant with Eliquis.  Does not wear home oxygen.   Previous medical history reviewed : Last seen in the ED on November 16 and 25.  Was seen because of weakness.  Present with cough congestion.  Negative workup.  With [facility] cardiology.  History of HFpEF.  Hypertension hyperlipidemia.  Permanent A-fib.  On metoprolol to tartrate 100 mg twice daily as well as Eliquis 5 mg twice daily.  Carotid stenosis on clopidogrel 75 mg daily.  Also with oncology.  Monoclonal gammopathy of unknown significance.   MDM:   Upon exam, patient hemodynamically stable.  Initially was 86% on room air.  Saturation around 90 to 92% in room air whenever I was in the room.  Sitting upright.  In terms of patient's epistaxis.  No current epistaxis.  Has some clots in the bilateral naris without any ongoing bleeding at this time.  No obvious bleeding vessel I can see at this time.  Will continue to watch.  Slightly decreased breath sounds bilaterally.  Denies any reported history of COPD.  No current smoking history.  Will give her a DuoNeb to see if this helps at all.  Also obtain chest x-ray, BNP assessment, volume status.  She has at least 1+ edema bilaterally in lower extremities.  Reevaluation:   Upon reexamination, patient hemodynamically stable.  Remains A&O x 3 with GCS 15.  His O2 saturation remains within normal limits when at rest but when up walking around and moving she subsequently dropped his O2 saturation.  Pulmonary edema on the x-ray.  Lower extremity edema ulnar exam.  HFpEF.  Not on a diuretic.  Potassium level of 3 so hesitant just to give her p.o. Lasix  and p.o. potassium for replacement discharge.  Given this, I did give  p.o. replacement of potassium and subsequently she will need to be admitted for further diuresis and electrolyte replacement going  further.  Hemoglobin dropped to 9.7.  Looks like around 11.  No signs of epistaxis at this time.  No bleeding episodes while here.  No concerns for GI bleed.  Will need to be watched.  Slight anion gap of 18.  Likely because of uremia of  53.  Patient's creatinine around baseline.   Patient will be admitted for diuresis.    Interventions: 40 meq K PO   EKG Interpreted by Me: sinus    Cardiac Tele Interpreted by Me: sinus    I have independently interpreted the CXR    Social Determinant of Health: denies current tobacco    Disposition and Follow Up: admit       Final diagnoses:  Acute pulmonary edema (HCC)  Peripheral edema  Hypokalemia  Epistaxis    ED Discharge Orders     None          Simon Lavonia SAILOR, MD 05/06/24 1342

## 2024-05-06 NOTE — TOC CM/SW Note (Signed)
 Transition of Care [facility]) - Inpatient Brief Assessment   Patient Details  Name: Katherine Hanson MRN: [id number] Date of Birth: Apr 20, 1941  Transition of Care [facility]) CM/SW Contact:    Lucie Lunger, LCSWA Phone Number: 05/06/2024, 3:19 PM   Clinical Narrative: Transition of Care Department [facility]) has reviewed patient and no TOC needs have been identified at this time. We will continue to monitor patient advancement through interdiciplinary progression rounds. If new patient transition needs arise, please place a TOC consult.  Transition of Care Asessment: Insurance and Status: Insurance coverage has been reviewed Patient has primary care physician: Yes Home environment has been reviewed: From home Prior level of function:: Independent Prior/Current Home Services: No current home services Social Drivers of Health Review: SDOH reviewed no interventions necessary Readmission risk has been reviewed: Yes Transition of care needs: no transition of care needs at this time

## 2024-05-06 NOTE — ED Notes (Signed)
 Pt ambulated to the BR with standby assistance and a cane.

## 2024-05-06 NOTE — ED Triage Notes (Signed)
 Pt arrived via POV c/o epistaxis since yesterday and reports SOB, fatigue, headache, and weakness. Pt reports taking a blood thinner as well.

## 2024-05-06 NOTE — Progress Notes (Addendum)
 Patient's respiratory status apparently worsened after she got moved to the floor.  She started feeling uncomfortable, oxygen requirement increased to 12 L/min, she was placed on nonrebreather mask.  She was alert and oriented.  Says she cannot breathe. Chest x-ray stat reveals bilateral pulmonary opacities, worsening edema, infection cannot be excluded. ABG was done showed pH of 7.43, pO2 70, pCO2 42, that was on nonrebreather mask. EKG revealed A-fib with RVR heart rate in 110s Troponin pending Patient was given IV Lasix  80 mg x 1 Plan to transfer to ICU Plan to start on BiPAP due to respiratory distress/hypoxia.  This will additionally help with pulmonary edema. Will place Foley catheter Will also start on IV antibiotics although infection is less likely  Addendum: Discussed with critical care at [facility].  Per discussion, with reassuring ABG, patient does not need to be transferred to [facility] ICU.

## 2024-05-06 NOTE — H&P (Signed)
 History and Physical    Katherine Hanson FMW:[id number] DOB: 1940-07-25 DOA: 05/06/2024  PCP: Lonna Millman, DO   Patient coming from: Home  I have personally briefly reviewed patient's old medical records in [facility]  Chief Complaint: generalized weakness  HPI: Katherine Hanson is a 83 y.o. 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  Patient reports of nasal bleeding overnight which she reports was excessive.  She reports losing a lot of blood and feeling extremely weak and fatigued.  Patient also has history of CKD and follows up with nephrology.  She saw nephrologist on Saturday, 2 day prior to presentation when Lasix  dose was increased to twice daily.  Patient reports gaining weight about 6 pounds in past few days  She denies any fever, chills, cough or chest pain.  She just feels tired.  Has some difficulty walking longer distance.  ED course: Vital signs stable, O2 sats in 90s on room air but she easily desaturated to 80s with ambulation.  She does not have active epistaxis at this time.  Laboratory work notable for potassium of 3.0 for which she received 1 dose of 40 mEq p.o. potassium.  Creatinine 2.04, BUN 53, baseline appears to be around 1.7.  proBNP 4626, previously 2887 on 04/11/2024.  Chest x-ray shows mild pulmonary edema.  Patient is admitted for acute on chronic CHF  Review of Systems: As per HPI otherwise all other systems were reviewed and are negative.   Past Medical History:  Diagnosis Date   Arthritis    CHF (congestive heart failure) (HCC)    Hay fever    High cholesterol    HTN (hypertension)    Seasonal allergies     Past Surgical History:  Procedure Laterality Date   ANKLE SURGERY  metal   2008 Dr. Daris right OTIF   cyst removed  breast    TONSILLECTOMY     TOTAL ABDOMINAL HYSTERECTOMY     TOTAL KNEE ARTHROPLASTY  left   Dr. Margrette     reports that she has never smoked. She has never used smokeless tobacco. She reports that she does not drink alcohol and does not use drugs.  Allergies  Allergen Reactions   Penicillins     Family History  Problem Relation Age of Onset   Arthritis Unknown    Hypertension Mother    Glaucoma Mother     Prior to Admission medications   Medication Sig Start Date End Date Taking? Authorizing Provider  allopurinol (ZYLOPRIM) 300 MG tablet Take 300 mg by mouth daily.   Yes [provider]  amLODipine (NORVASC) 10 MG tablet Take 10 mg by mouth daily. 01/27/24  Yes [provider]  atorvastatin (LIPITOR) 40 MG tablet Take 20 mg by mouth daily. 02/29/24  Yes [provider]  ELIQUIS 5 MG TABS tablet Take 1 tablet by mouth daily. 03/16/23  Yes [provider]  fluticasone OREN) 50 MCG/ACT nasal spray  11/16/10  Yes [provider]  furosemide  (LASIX ) 20 MG tablet Take 1 tablet (20 mg total) by mouth daily for 7 days. Patient taking differently: Take 20 mg by mouth 2 (two) times daily. 04/11/24 05/06/24 Yes Garrick Charleston, MD  hydrALAZINE (APRESOLINE) 50 MG tablet Take 50 mg by mouth 2 (two) times daily. 04/04/24  Yes [provider]  latanoprost (XALATAN) 0.005 % ophthalmic  solution 1 drop at bedtime. 12/07/22  Yes [provider]  losartan (COZAAR) 25 MG tablet Take 25 mg by mouth daily. 01/26/24  Yes [provider]  LUMIGAN 0.01 % SOLN Place 1 drop into both eyes at bedtime. 01/30/18  Yes [provider]  metoprolol tartrate (LOPRESSOR) 100 MG tablet Take 100 mg by mouth 2 (two) times daily. 03/07/24  Yes [provider]  triamcinolone cream (KENALOG) 0.1 % Apply 1 Application topically 3 (three) times daily. 02/29/24  Yes [provider]  Vitamin D, Ergocalciferol, (DRISDOL) 1.25 MG (50000 UNIT)  CAPS capsule Take by mouth. Patient not taking: Reported on 05/06/2024 03/07/23   [provider]    Physical Exam: Vitals:   05/06/24 1230 05/06/24 1245 05/06/24 1300 05/06/24 1315  BP: 118/84 137/66 (!) 161/68 (!) 158/62  Pulse:  88 95 84  Resp: 18 18 10 15   Temp:      TempSrc:      SpO2:  92%  99%  Weight:      Height:        Constitutional: NAD, calm, comfortable Vitals:   05/06/24 1230 05/06/24 1245 05/06/24 1300 05/06/24 1315  BP: 118/84 137/66 (!) 161/68 (!) 158/62  Pulse:  88 95 84  Resp: 18 18 10 15   Temp:      TempSrc:      SpO2:  92%  99%  Weight:      Height:       General: Alert, oriented not in any acute distress:  HEENT: Moist oral mucosa, no active nasal bleeding Chest: mild crackles bilaterally, no wheezing CVS: S1, S2, no murmur,  irregular rhythm Abdomen: Soft, nontender, obese Extremities: Bilateral pitting edema Neuro: No focal deficits  Labs on Admission: I have personally reviewed following labs and imaging studies  CBC: Recent Labs  Lab 05/06/24 1209  WBC 8.3  NEUTROABS 6.7  HGB 9.7*  HCT 29.6*  MCV 92.5  PLT 163   Basic Metabolic Panel: Recent Labs  Lab 05/06/24 1150  NA 137  K 3.0*  CL 97*  CO2 22  GLUCOSE 140*  BUN 53*  CREATININE 2.04*  CALCIUM 9.5  MG 2.5*   GFR: Estimated Creatinine Clearance: 26.1 mL/min (A) (by C-G formula based on SCr of 2.04 mg/dL (H)). Liver Function Tests: Recent Labs  Lab 05/06/24 1150  AST 28  ALT 21  ALKPHOS 114  BILITOT 1.2  PROT 7.4  ALBUMIN 4.5   No results for input(s): LIPASE, AMYLASE in the last 168 hours. No results for input(s): AMMONIA in the last 168 hours. Coagulation Profile: Recent Labs  Lab 05/06/24 1150  INR 1.5*   Cardiac Enzymes: No results for input(s): CKTOTAL, CKMB, CKMBINDEX, TROPONINI in the last 168 hours. BNP (last 3 results) Recent Labs    04/11/24 1147 05/06/24 1150  PROBNP 2,887.0* 4,626.0*   HbA1C: No results for  input(s): HGBA1C in the last 72 hours. CBG: No results for input(s): GLUCAP in the last 168 hours. Lipid Profile: No results for input(s): CHOL, HDL, LDLCALC, TRIG, CHOLHDL, LDLDIRECT in the last 72 hours. Thyroid Function Tests: No results for input(s): TSH, T4TOTAL, FREET4, T3FREE, THYROIDAB in the last 72 hours. Anemia Panel: No results for input(s): VITAMINB12, FOLATE, FERRITIN, TIBC, IRON, RETICCTPCT in the last 72 hours. Urine analysis:    Component Value Date/Time   APPEARANCEUR Clear 03/18/2024 1141   GLUCOSEU Negative 03/18/2024 1141   BILIRUBINUR Negative 03/18/2024 1141   PROTEINUR Trace 03/18/2024 1141   NITRITE Negative 03/18/2024  1141   LEUKOCYTESUR Negative 03/18/2024 1141    Radiological Exams on Admission: DG Chest Port 1 View Result Date: 05/06/2024 CLINICAL DATA:  Shortness of breath. EXAM: PORTABLE CHEST 1 VIEW COMPARISON:  04/11/2024. FINDINGS: Cardiomegaly, unchanged. Aortic atherosclerosis. Bilateral interstitial opacities, most pronounced at the lung bases, slightly more pronounced compared to the prior exam. No pleural effusion or pneumothorax. No acute osseous abnormality. IMPRESSION: 1. Bilateral interstitial opacities are slightly more pronounced compared to the prior exam and may reflect interstitial pulmonary edema. 2. Cardiomegaly. Electronically Signed   By: Harrietta Sherry M.D.   On: 05/06/2024 11:21    EKG: Independently reviewed.  Artifacts.  PVCs, A-fib  Assessment/Plan  Acute on chronic heart failure with preserved ejection fraction:  - Appears hypervolemic, chest x-ray with mild pulmonary edema, proBNP elevated -Will supplement potassium 40 mEq every 4 hours x 3, give Lasix  40 mg IV twice daily with close monitoring of electrolytes and kidney function, strict intake and output, fluid restriction, low-salt diet -Previous echocardiogram was performed T11, 2011, normal LVEF, grade 3 diastolic  dysfunction. Will obtain complete echocardiogram. Patient follows [facility] with [facility] cardiology Resume home medications including metoprolol, amlodipine.  Hold losartan due to elevated creatinine. Hold SGLT2  History of permanent A-fib: Continue metoprolol. Hold Eliquis today.  If no recurrence of epistaxis, resume tomorrow  Epistaxis: Patient reported large amount of bleeding overnight.  Currently resolved spontaneously.  Will hold Eliquis, Plavix.  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 Observation status is judged to be reasonable and necessary in order to provide the required intensity of service to ensure the patient's safety. The patient's presenting symptoms, physical exam findings, and initial radiographic and laboratory data in the context of their medical condition is felt to place them at decreased risk for further clinical deterioration. Furthermore, it is anticipated that the patient will be medically stable for discharge from the hospital within 2 midnights of admission.     Derryl Duval MD [facility]  05/06/2024, 2:15 PM

## 2024-05-06 NOTE — Consult Note (Addendum)
 PHARMACY - ANTICOAGULATION CONSULT NOTE  Pharmacy Consult for Heparin  Indication: atrial fibrillation  Allergies  Allergen Reactions   Penicillins     Patient Measurements: Height: 5' 3 (160 cm) Weight: 115.7 kg (255 lb) IBW/kg (Calculated) : 52.4 HEPARIN  DW (KG): 80.6  Vital Signs: Temp: 98.2 F (36.8 C) (12/01 1812) Temp Source: Axillary (12/01 1812) BP: 144/94 (12/01 1918) Pulse Rate: 98 (12/01 1918)  Labs: Recent Labs    05/06/24 1150 05/06/24 1209  HGB  --  9.7*  HCT  --  29.6*  PLT  --  163  APTT 39*  --   LABPROT 18.4*  --   INR 1.5*  --   CREATININE 2.04*  --     Estimated Creatinine Clearance: 26.1 mL/min (A) (by C-G formula based on SCr of 2.04 mg/dL (H)).   Medical History: Past Medical History:  Diagnosis Date   Arthritis    CHF (congestive heart failure) (HCC)    Hay fever    High cholesterol    HTN (hypertension)    Seasonal allergies     Medications:  Medications Prior to Admission  Medication Sig Dispense Refill Last Dose/Taking   allopurinol (ZYLOPRIM) 300 MG tablet Take 300 mg by mouth daily.   05/05/2024 Morning   amLODipine (NORVASC) 10 MG tablet Take 10 mg by mouth daily.   05/05/2024 Morning   atorvastatin (LIPITOR) 40 MG tablet Take 20 mg by mouth daily.   05/05/2024 Morning   ELIQUIS  5 MG TABS tablet Take 1 tablet by mouth daily.   05/05/2024 at  8:00 AM   fluticasone (FLONASE) 50 MCG/ACT nasal spray    Past Week   furosemide  (LASIX ) 20 MG tablet Take 1 tablet (20 mg total) by mouth daily for 7 days. (Patient taking differently: Take 20 mg by mouth 2 (two) times daily.) 30 tablet 0 05/05/2024 Morning   hydrALAZINE (APRESOLINE) 50 MG tablet Take 50 mg by mouth 2 (two) times daily.   05/05/2024 Evening   latanoprost (XALATAN) 0.005 % ophthalmic solution 1 drop at bedtime.   Past Week   losartan (COZAAR) 25 MG tablet Take 25 mg by mouth daily.   05/05/2024 Morning   LUMIGAN 0.01 % SOLN Place 1 drop into both eyes at bedtime.   Past  Week   metoprolol  tartrate (LOPRESSOR ) 100 MG tablet Take 100 mg by mouth 2 (two) times daily.   05/05/2024 Evening   triamcinolone cream (KENALOG) 0.1 % Apply 1 Application topically 3 (three) times daily.   Unknown   Vitamin D, Ergocalciferol, (DRISDOL) 1.25 MG (50000 UNIT) CAPS capsule Take by mouth. (Patient not taking: Reported on 05/06/2024)   Not Taking   Scheduled:   Chlorhexidine  Gluconate Cloth  6 each Topical Daily   furosemide   40 mg Intravenous Q12H   insulin  aspart  0-15 Units Subcutaneous TID WC    morphine  injection  2 mg Intravenous Once   pantoprazole  (PROTONIX ) IV  40 mg Intravenous Daily   potassium chloride   40 mEq Oral Q4H   Infusions:   levofloxacin  (LEVAQUIN ) IV 750 mg (05/06/24 1818)   potassium chloride  10 mEq (05/06/24 1824)   PRN: acetaminophen  **OR** acetaminophen , ipratropium, oxymetazoline , sodium chloride  Anti-infectives (From admission, onward)    Start     Dose/Rate Route Frequency Ordered Stop   05/06/24 1800  cefTRIAXone (ROCEPHIN) 1 g in sodium chloride  0.9 % 100 mL IVPB  Status:  Discontinued        1 g 200 mL/hr over 30 Minutes Intravenous Every 24  hours 05/06/24 1712 05/06/24 1724   05/06/24 1800  levofloxacin (LEVAQUIN) IVPB 750 mg        750 mg 100 mL/hr over 90 Minutes Intravenous Every 48 hours 05/06/24 1724         Assessment: 83 y.o. 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 Hgb below baseline, will need to monitor. Will use aPTT to monitor heparin, as anti-xa will be falsely elevated. Last dose of apixaban was 11/30 per med rec.   Goal of Therapy:  Heparin level 0.3-0.7 units/ml once aPTT and heparin level correlate.  aPTT 66-102 seconds Monitor platelets by anticoagulation protocol: Yes   Plan:  Give 4000 units bolus x 1 Start  heparin infusion at 1100 units/hr Check aPTT level in 8 hours and daily heparin level and CBC while on heparin Continue to monitor H&H and platelets  Cathaleen GORMAN Blanch, PharmD, BCPS 05/06/2024,7:28 PM

## 2024-05-07 ENCOUNTER — Encounter (HOSPITAL_COMMUNITY): Payer: Self-pay | Admitting: Hospitalist

## 2024-05-07 ENCOUNTER — Other Ambulatory Visit (HOSPITAL_COMMUNITY): Payer: Self-pay | Admitting: *Deleted

## 2024-05-07 ENCOUNTER — Inpatient Hospital Stay (HOSPITAL_COMMUNITY)

## 2024-05-07 DIAGNOSIS — I2489 Other forms of acute ischemic heart disease: Secondary | ICD-10-CM | POA: Diagnosis not present

## 2024-05-07 DIAGNOSIS — I5033 Acute on chronic diastolic (congestive) heart failure: Secondary | ICD-10-CM | POA: Diagnosis not present

## 2024-05-07 DIAGNOSIS — N179 Acute kidney failure, unspecified: Secondary | ICD-10-CM | POA: Diagnosis not present

## 2024-05-07 DIAGNOSIS — I5030 Unspecified diastolic (congestive) heart failure: Secondary | ICD-10-CM | POA: Diagnosis not present

## 2024-05-07 DIAGNOSIS — I25119 Atherosclerotic heart disease of native coronary artery with unspecified angina pectoris: Secondary | ICD-10-CM | POA: Diagnosis not present

## 2024-05-07 DIAGNOSIS — J9601 Acute respiratory failure with hypoxia: Secondary | ICD-10-CM

## 2024-05-07 DIAGNOSIS — I4821 Permanent atrial fibrillation: Secondary | ICD-10-CM

## 2024-05-07 LAB — GLUCOSE, CAPILLARY
Glucose-Capillary: 134 mg/dL — ABNORMAL HIGH (ref 70–99)
Glucose-Capillary: 134 mg/dL — ABNORMAL HIGH (ref 70–99)
Glucose-Capillary: 176 mg/dL — ABNORMAL HIGH (ref 70–99)
Glucose-Capillary: 126 mg/dL — ABNORMAL HIGH (ref 70–99)
Glucose-Capillary: 131 mg/dL — ABNORMAL HIGH (ref 70–99)

## 2024-05-07 LAB — ECHOCARDIOGRAM COMPLETE
AR max vel: 0.62 cm2
AV Area VTI: 0.61 cm2
AV Area mean vel: 0.67 cm2
AV Mean grad: 22.5 mmHg
AV Peak grad: 36.8 mmHg
Ao pk vel: 3.04 m/s
Area-P 1/2: 3.37 cm2
Height: 63 in
MV M vel: 4.97 m/s
MV Peak grad: 98.8 mmHg
S' Lateral: 2.9 cm
Weight: 4102.32 [oz_av]

## 2024-05-07 LAB — HEPARIN LEVEL (UNFRACTIONATED): Heparin Unfractionated: >1.1 [IU]/mL — ABNORMAL HIGH (ref 0.30–0.70)

## 2024-05-07 LAB — COMPREHENSIVE METABOLIC PANEL WITH GFR
ALT: 17 U/L (ref 0–44)
AST: 32 U/L (ref 15–41)
Albumin: 3.9 g/dL (ref 3.5–5.0)
Alkaline Phosphatase: 97 U/L (ref 38–126)
Anion gap: 12 (ref 5–15)
BUN: 56 mg/dL — ABNORMAL HIGH (ref 8–23)
CO2: 28 mmol/L (ref 22–32)
Calcium: 9.4 mg/dL (ref 8.9–10.3)
Chloride: 99 mmol/L (ref 98–111)
Creatinine, Ser: 2.39 mg/dL — ABNORMAL HIGH (ref 0.44–1.00)
GFR, Estimated: 20 mL/min — ABNORMAL LOW (ref >60)
Glucose, Bld: 133 mg/dL — ABNORMAL HIGH (ref 70–99)
Potassium: 3.8 mmol/L (ref 3.5–5.1)
Sodium: 140 mmol/L (ref 135–145)
Total Bilirubin: 1.5 mg/dL — ABNORMAL HIGH (ref 0.0–1.2)
Total Protein: 6.6 g/dL (ref 6.5–8.1)

## 2024-05-07 LAB — CBC
HCT: 26.2 % — ABNORMAL LOW (ref 36.0–46.0)
Hemoglobin: 8.6 g/dL — ABNORMAL LOW (ref 12.0–15.0)
MCH: 30 pg (ref 26.0–34.0)
MCHC: 32.8 g/dL (ref 30.0–36.0)
MCV: 91.3 fL (ref 80.0–100.0)
Platelets: 143 K/uL — ABNORMAL LOW (ref 150–400)
RBC: 2.87 MIL/uL — ABNORMAL LOW (ref 3.87–5.11)
RDW: 15.6 % — ABNORMAL HIGH (ref 11.5–15.5)
WBC: 12 K/uL — ABNORMAL HIGH (ref 4.0–10.5)
nRBC: 0 % (ref 0.0–0.2)

## 2024-05-07 LAB — APTT: aPTT: 168 s (ref 24–36)

## 2024-05-07 LAB — TROPONIN T, HIGH SENSITIVITY: Troponin T High Sensitivity: 496 ng/L (ref 0–19)

## 2024-05-07 LAB — MAGNESIUM: Magnesium: 2.2 mg/dL (ref 1.7–2.4)

## 2024-05-07 MED ORDER — POTASSIUM CHLORIDE 20 MEQ PO PACK
20.0000 meq | PACK | Freq: Two times a day (BID) | ORAL | Status: DC
  Administered 2024-05-07 – 2024-05-09 (×4): 20 meq via ORAL
  Filled 2024-05-07 (×4): qty 1

## 2024-05-07 MED ORDER — METOPROLOL TARTRATE 50 MG PO TABS
50.0000 mg | ORAL_TABLET | Freq: Two times a day (BID) | ORAL | Status: DC
  Administered 2024-05-07 – 2024-05-08 (×3): 50 mg via ORAL
  Filled 2024-05-07 (×3): qty 1

## 2024-05-07 MED ORDER — METOPROLOL TARTRATE 5 MG/5ML IV SOLN
5.0000 mg | Freq: Once | INTRAVENOUS | Status: AC
  Administered 2024-05-07: 5 mg via INTRAVENOUS
  Filled 2024-05-07: qty 5

## 2024-05-07 NOTE — Plan of Care (Signed)

## 2024-05-07 NOTE — Progress Notes (Signed)
*  PRELIMINARY RESULTS* Echocardiogram 2D Echocardiogram has been performed.  Katherine Hanson 05/07/2024, 3:04 PM

## 2024-05-07 NOTE — Consult Note (Addendum)
 PHARMACY - ANTICOAGULATION CONSULT NOTE  Pharmacy Consult for Heparin Indication: atrial fibrillation  Allergies  Allergen Reactions   Penicillins     Patient Measurements: Height: 5' 3 (160 cm) Weight: 116.3 kg (256 lb 6.3 oz) IBW/kg (Calculated) : 52.4 HEPARIN DW (KG): 80.6  Vital Signs: Temp: 100 F (37.8 C) (12/02 0426) Temp Source: Axillary (12/02 0426) BP: 111/59 (12/02 0600) Pulse Rate: 97 (12/02 0753)  Labs: Recent Labs    05/06/24 1150 05/06/24 1209 05/06/24 1957 05/07/24 0529  HGB  --  9.7*  --  8.6*  HCT  --  29.6*  --  26.2*  PLT  --  163  --  143*  APTT 39*  --  36 168*  LABPROT 18.4*  --   --   --   INR 1.5*  --   --   --   HEPARINUNFRC  --   --  >1.10* >1.10*  CREATININE 2.04*  --  2.06* 2.39*    Estimated Creatinine Clearance: 22.3 mL/min (A) (by C-G formula based on SCr of 2.39 mg/dL (H)).   Medical History: Past Medical History:  Diagnosis Date   Arthritis    CHF (congestive heart failure) (HCC)    Hay fever    High cholesterol    HTN (hypertension)    Seasonal allergies     Medications:  Medications Prior to Admission  Medication Sig Dispense Refill Last Dose/Taking   allopurinol (ZYLOPRIM) 300 MG tablet Take 300 mg by mouth daily.   05/05/2024 Morning   amLODipine (NORVASC) 10 MG tablet Take 10 mg by mouth daily.   05/05/2024 Morning   atorvastatin (LIPITOR) 40 MG tablet Take 20 mg by mouth daily.   05/05/2024 Morning   ELIQUIS 5 MG TABS tablet Take 1 tablet by mouth daily.   05/05/2024 at  8:00 AM   fluticasone (FLONASE) 50 MCG/ACT nasal spray    Past Week   furosemide  (LASIX ) 20 MG tablet Take 1 tablet (20 mg total) by mouth daily for 7 days. (Patient taking differently: Take 20 mg by mouth 2 (two) times daily.) 30 tablet 0 05/05/2024 Morning   hydrALAZINE (APRESOLINE) 50 MG tablet Take 50 mg by mouth 2 (two) times daily.   05/05/2024 Evening   latanoprost (XALATAN) 0.005 % ophthalmic solution 1 drop at bedtime.   Past Week    losartan (COZAAR) 25 MG tablet Take 25 mg by mouth daily.   05/05/2024 Morning   LUMIGAN 0.01 % SOLN Place 1 drop into both eyes at bedtime.   Past Week   metoprolol tartrate (LOPRESSOR) 100 MG tablet Take 100 mg by mouth 2 (two) times daily.   05/05/2024 Evening   triamcinolone cream (KENALOG) 0.1 % Apply 1 Application topically 3 (three) times daily.   Unknown   Vitamin D, Ergocalciferol, (DRISDOL) 1.25 MG (50000 UNIT) CAPS capsule Take by mouth. (Patient not taking: Reported on 05/06/2024)   Not Taking   Scheduled:   Chlorhexidine Gluconate Cloth  6 each Topical Daily   insulin aspart  0-15 Units Subcutaneous TID WC    morphine injection  2 mg Intravenous Once   pantoprazole (PROTONIX) IV  40 mg Intravenous Daily   Infusions:   furosemide  (LASIX ) 200 mg in dextrose 5 % 100 mL (2 mg/mL) infusion 8 mg/hr (05/06/24 2313)   heparin 1,100 Units/hr (05/06/24 2313)   levofloxacin (LEVAQUIN) IV Stopped (05/06/24 1949)   PRN: acetaminophen  **OR** acetaminophen , ipratropium, oxymetazoline, sodium chloride Anti-infectives (From admission, onward)    Start  Dose/Rate Route Frequency Ordered Stop   05/06/24 1800  cefTRIAXone (ROCEPHIN) 1 g in sodium chloride 0.9 % 100 mL IVPB  Status:  Discontinued        1 g 200 mL/hr over 30 Minutes Intravenous Every 24 hours 05/06/24 1712 05/06/24 1724   05/06/24 1800  levofloxacin (LEVAQUIN) IVPB 750 mg        750 mg 100 mL/hr over 90 Minutes Intravenous Every 48 hours 05/06/24 1724         Assessment: 83 y.o. 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 Hgb below baseline, will need to monitor. Will use aPTT to monitor heparin, as anti-xa will be falsely elevated. Last dose of apixaban was 11/30 per med rec.   APTT 36> 168, no bleeding, spoke with RN and  verified peripheral blood draw from opposite arm of infusion. Will adjust HL> 1.1, as anticipated with eliquis  Hgb 9.7> 8.6, trending down. MD wants to discontinue  Goal of Therapy:  Heparin level 0.3-0.7 units/ml once aPTT and heparin level correlate.  aPTT 66-102 seconds Monitor platelets by anticoagulation protocol: Yes   Plan:  D/C heparin per MD   Cherlyn Boers, BS Pharm D, BCPS Clinical Pharmacist 05/07/2024,7:59 AM

## 2024-05-07 NOTE — Progress Notes (Signed)
 PROGRESS NOTE    Katherine Hanson  FMW:[id number] DOB: 1940/11/19 DOA: 05/06/2024 PCP: Lonna Millman, DO   Brief Narrative:   Katherine Hanson is a 83 y.o. 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  Patient reports of nasal bleeding overnight which she reports was excessive.  She reports losing a lot of blood and feeling extremely weak and fatigued.  Patient also has history of CKD and follows up with nephrology.  She saw nephrologist on Saturday, 2 day prior to presentation when Lasix  dose was increased to twice daily.  Patient reports gaining weight about 6 pounds in past few days  She denies any fever, chills, cough or chest pain.  She just feels tired.  Has some difficulty walking longer distance.   ED course: Vital signs stable, O2 sats in 90s on room air but she easily desaturated to 80s with ambulation.  She does not have active epistaxis at this time.  Laboratory work notable for potassium of 3.0 for which she received 1 dose of 40 mEq p.o. potassium.  Creatinine 2.04, BUN 53, baseline appears to be around 1.7.  proBNP 4626, previously 2887 on 04/11/2024.  Chest x-ray shows mild pulmonary edema.   Patient is admitted for acute on chronic CHF Patient received IV Lasix  40 mg x 1.  Shortly after arrival to floor, patient became acutely short of breath with worsening hypoxia needing oxygen as high as 2 L/min, she was placed on NRB, ABG was reassuring with pH 7.43, pCO2 42 and O2 72, chest x-ray revealed worsening edema, patient was restless and was placed on BiPAP and transferred to ICU.  She was placed on IV Lasix  drip with good urine output.  After open overnight BiPAP, she is transitioned to nasal cannula 12/2.  Assessment & Plan:   Principal Problem:   Acute exacerbation of CHF (congestive  heart failure) (HCC) Active Problems:   MGUS (monoclonal gammopathy of unknown significance)   CKD (chronic kidney disease) stage 4, GFR 15-29 ml/min (HCC)   Normocytic anemia   Essential hypertension   (HFpEF) heart failure with preserved ejection fraction (HCC)   Pulmonary edema   Acute on chronic heart failure with preserved ejection fraction: ?  Flash pulmonary edema   - Appears hypervolemic, chest x-ray with mild pulmonary edema, proBNP elevated -Give IV Lasix  push on admission, then started on Lasix  drip due to worsening pulmonary edema. -Previous echocardiogram was performed in 2021, normal LVEF, grade 3 diastolic dysfunction. -History of CAD with stent, on Plavix Will obtain complete echocardiogram. Patient follows up with [facility] cardiology Resume home medications including metoprolol, amlodipine.  Hold losartan due to elevated creatinine. Hold SGLT2 Cardiology consulted, spoke with Dr. Debera  Acute hypoxic respiratory failure: Respiratory status acutely worsened after arrival to floor from ER.  Chest x-ray reveals worsening pulmonary edema, less likely infection.  IV Levaquin was started given worsening pulmonary opacities however likelihood of infection is low Patient placed on BiPAP, started on IV Lasix  drip with good output Will continue to monitor intake and output, electrolytes and kidney function Creatinine is 2.3 with baseline around 1.7. EKG with A-fib with rate in 110s, no ST segment elevation.  History of CAD with stent Non-STEMI type II: Troponin T elevated and rising, demand ischemia versus plaque rupture.   Patient initiated on IV heparin however this is discontinued due to drop in  hemoglobin.  No evidence of active bleeding.  Of note patient had epistaxis at home that resolved prior to presentation to the hospital   History of permanent A-fib: Continue metoprolol . Takes Eliquis  at home, currently anticoagulated on on hold due to drop in  hemoglobin  Epistaxis: Patient reported large amount of bleeding overnight.  Currently resolved spontaneously.   Started on IV heparin  due to ACS/rising troponin.  Acute on chronic anemia: Baseline hemoglobin 11, on admission 9.7, dropped to 8.6. Will watch for any clinical bleeding. FOBT might not be accurate due to epistaxis   Hypertension, hyperlipidemia: Hold losartan   CKD stage IV: - Monitor BMP closely while on diuretics   Hypokalemia: Replace potassium   MGUS: Follows up with  oncology   Morbid obesity: BMI 45     DVT prophylaxis: Currently on IV heparin , SCDs Code Status: Full Family Communication: Daughter at the bedside Disposition Plan: Home Consults called: cardiology Admission status: Observation Severity of Illness: The appropriate patient status for this patient is OBSERVATION. Observation status is judged to be reasonable and necessary in order to provide the required intensity of service to ensure the patient's safety. The patient's presenting symptoms, physical exam findings, and initial radiographic and laboratory data in the context of their medical condition is felt to place them at decreased risk for further clinical deterioration. Furthermore, it is anticipated that the patient will be medically stable for discharge from the hospital within 2 midnights of admission.  Subjective:  Patient seen and examined at the bedside earlier today.  She remained on BiPAP overnight.  Tolerated IV Lasix  well with good urine output.  Patient not breathing heavy today.  She is placed on nasal cannula.  Objective: Vitals:   05/07/24 0500 05/07/24 0501 05/07/24 0600 05/07/24 0646  BP: (!) 102/44 (!) 102/44 (!) 111/59   Pulse: (!) 103 93 93 92  Resp: (!) 21 20 (!) 23 (!) 23  Temp:      TempSrc:      SpO2: 100% 100% 100% 100%  Weight:      Height:        Intake/Output Summary (Last 24 hours) at 05/07/2024 0736 Last data filed at 05/07/2024 0426 Gross per 24 hour   Intake 417.92 ml  Output 2250 ml  Net -1832.08 ml   Filed Weights   05/06/24 0953 05/07/24 0426  Weight: 115.7 kg 116.3 kg    Examination:     Data Reviewed: I have personally reviewed following labs and imaging studies  CBC: Recent Labs  Lab 05/06/24 1209 05/07/24 0529  WBC 8.3 12.0*  NEUTROABS 6.7  --   HGB 9.7* 8.6*  HCT 29.6* 26.2*  MCV 92.5 91.3  PLT 163 143*   Basic Metabolic Panel: Recent Labs  Lab 05/06/24 1150 05/06/24 1957 05/07/24 0529  NA 137 139 140  K 3.0* 4.0 3.8  CL 97* 99 99  CO2 22 26 28   GLUCOSE 140* 155* 133*  BUN 53* 53* 56*  CREATININE 2.04* 2.06* 2.39*  CALCIUM 9.5 9.4 9.4  MG 2.5*  --   --    GFR: Estimated Creatinine Clearance: 22.3 mL/min (A) (by C-G formula based on SCr of 2.39 mg/dL (H)). Liver Function Tests: Recent Labs  Lab 05/06/24 1150 05/07/24 0529  AST 28 32  ALT 21 17  ALKPHOS 114 97  BILITOT 1.2 1.5*  PROT 7.4 6.6  ALBUMIN 4.5 3.9   No results for input(s): LIPASE, AMYLASE in the last 168 hours. No results for input(s): AMMONIA  in the last 168 hours. Coagulation Profile: Recent Labs  Lab 05/06/24 1150  INR 1.5*   Cardiac Enzymes: No results for input(s): CKTOTAL, CKMB, CKMBINDEX, TROPONINI in the last 168 hours. BNP (last 3 results) Recent Labs    04/11/24 1147 05/06/24 1150  PROBNP 2,887.0* 4,626.0*   HbA1C: No results for input(s): HGBA1C in the last 72 hours. CBG: Recent Labs  Lab 05/06/24 1817 05/06/24 2111  GLUCAP 165* 130*   Lipid Profile: No results for input(s): CHOL, HDL, LDLCALC, TRIG, CHOLHDL, LDLDIRECT in the last 72 hours. Thyroid Function Tests: No results for input(s): TSH, T4TOTAL, FREET4, T3FREE, THYROIDAB in the last 72 hours. Anemia Panel: No results for input(s): VITAMINB12, FOLATE, FERRITIN, TIBC, IRON, RETICCTPCT in the last 72 hours. Sepsis Labs: Recent Labs  Lab 05/06/24 1743  PROCALCITON <0.10    Recent  Results (from the past 240 hours)  Resp panel by RT-PCR (RSV, Flu A&B, Covid) Anterior Nasal Swab     Status: None   Collection Time: 05/06/24 10:27 AM   Specimen: Anterior Nasal Swab  Result Value Ref Range Status   SARS Coronavirus 2 by RT PCR NEGATIVE NEGATIVE Final    Comment: (NOTE) SARS-CoV-2 target nucleic acids are NOT DETECTED.  The SARS-CoV-2 RNA is generally detectable in upper respiratory specimens during the acute phase of infection. The lowest concentration of SARS-CoV-2 viral copies this assay can detect is 138 copies/mL. A negative result does not preclude SARS-Cov-2 infection and should not be used as the sole basis for treatment or other patient management decisions. A negative result may occur with  improper specimen collection/handling, submission of specimen other than nasopharyngeal swab, presence of viral mutation(s) within the areas targeted by this assay, and inadequate number of viral copies(<138 copies/mL). A negative result must be combined with clinical observations, patient history, and epidemiological information. The expected result is Negative.  Fact Sheet for Patients:  bloggercourse.com  Fact Sheet for Healthcare Providers:  seriousbroker.it  This test is no t yet approved or cleared by the [place]  FDA and  has been authorized for detection and/or diagnosis of SARS-CoV-2 by FDA under an Emergency Use Authorization (EUA). This EUA will remain  in effect (meaning this test can be used) for the duration of the COVID-19 declaration under Section 564(b)(1) of the Act, 21 U.S.C.section 360bbb-3(b)(1), unless the authorization is terminated  or revoked sooner.       Influenza A by PCR NEGATIVE NEGATIVE Final   Influenza B by PCR NEGATIVE NEGATIVE Final    Comment: (NOTE) The Xpert Xpress SARS-CoV-2/FLU/RSV plus assay is intended as an aid in the diagnosis of influenza from Nasopharyngeal swab  specimens and should not be used as a sole basis for treatment. Nasal washings and aspirates are unacceptable for Xpert Xpress SARS-CoV-2/FLU/RSV testing.  Fact Sheet for Patients: bloggercourse.com  Fact Sheet for Healthcare Providers: seriousbroker.it  This test is not yet approved or cleared by the [place]  FDA and has been authorized for detection and/or diagnosis of SARS-CoV-2 by FDA under an Emergency Use Authorization (EUA). This EUA will remain in effect (meaning this test can be used) for the duration of the COVID-19 declaration under Section 564(b)(1) of the Act, 21 U.S.C. section 360bbb-3(b)(1), unless the authorization is terminated or revoked.     Resp Syncytial Virus by PCR NEGATIVE NEGATIVE Final    Comment: (NOTE) Fact Sheet for Patients: bloggercourse.com  Fact Sheet for Healthcare Providers: seriousbroker.it  This test is not yet approved or cleared by the [place]   FDA and has been authorized for detection and/or diagnosis of SARS-CoV-2 by FDA under an Emergency Use Authorization (EUA). This EUA will remain in effect (meaning this test can be used) for the duration of the COVID-19 declaration under Section 564(b)(1) of the Act, 21 U.S.C. section 360bbb-3(b)(1), unless the authorization is terminated or revoked.  Performed at [facility], [address] [place], [place] [address]   MRSA Next Gen by PCR, Nasal     Status: None   Collection Time: 05/06/24  6:23 PM   Specimen: Nasal Mucosa; Nasal Swab  Result Value Ref Range Status   MRSA by PCR Next Gen NOT DETECTED NOT DETECTED Final    Comment: (NOTE) The GeneXpert MRSA Assay (FDA approved for NASAL specimens only), is one component of a comprehensive MRSA colonization surveillance program. It is not intended to diagnose MRSA infection nor to guide or monitor treatment for MRSA infections. Test  performance is not FDA approved in patients less than 98 years old. Performed at [facility], [address] [place], [place] [address]          Radiology Studies: US  EKG SITE RITE Result Date: 05/06/2024 If Site Rite image not attached, placement could not be confirmed due to current cardiac rhythm.  DG CHEST PORT 1 VIEW Result Date: 05/06/2024 EXAM: 1 VIEW(S) XRAY OF THE CHEST 05/06/2024 05:07:00 PM COMPARISON: 05/06/2024 CLINICAL HISTORY: Shortness of breath. FINDINGS: LINES, TUBES AND DEVICES: Multiple wires and leads project over the chest on the frontal radiograph. LUNGS AND PLEURA: Diffuse lower lung predominantly interstitial and airspace disease is increased, especially on the left. Limited evaluation for pleural effusion secondary to patient body habitus. No large pleural effusion. No pneumothorax. HEART AND MEDIASTINUM: Moderate cardiomegaly. BONES AND SOFT TISSUES: No acute osseous abnormality. IMPRESSION: 1. technique degradation is detailed above. 2. Worsened diffuse interstitial and airspace disease, especially on the left, favoring pulmonary edema.concurrent infection cannot be excluded. Electronically signed by: Rockey Kilts MD 05/06/2024 05:58 PM EST RP Workstation: [id number]   DG Chest Port 1 View Result Date: 05/06/2024 CLINICAL DATA:  Shortness of breath. EXAM: PORTABLE CHEST 1 VIEW COMPARISON:  04/11/2024. FINDINGS: Cardiomegaly, unchanged. Aortic atherosclerosis. Bilateral interstitial opacities, most pronounced at the lung bases, slightly more pronounced compared to the prior exam. No pleural effusion or pneumothorax. No acute osseous abnormality. IMPRESSION: 1. Bilateral interstitial opacities are slightly more pronounced compared to the prior exam and may reflect interstitial pulmonary edema. 2. Cardiomegaly. Electronically Signed   By: Harrietta Sherry M.D.   On: 05/06/2024 11:21        Scheduled Meds:  Chlorhexidine  Gluconate Cloth  6 each Topical Daily    insulin  aspart  0-15 Units Subcutaneous TID WC    morphine  injection  2 mg Intravenous Once   pantoprazole  (PROTONIX ) IV  40 mg Intravenous Daily   Continuous Infusions:  furosemide  (LASIX ) 200 mg in dextrose  5 % 100 mL (2 mg/mL) infusion 8 mg/hr (05/06/24 2313)   heparin  1,100 Units/hr (05/06/24 2313)   levofloxacin  (LEVAQUIN ) IV Stopped (05/06/24 1949)          Derryl Duval, MD [facility] 05/07/2024, 7:36 AM

## 2024-05-07 NOTE — Consult Note (Signed)
 CARDIOLOGY CONSULTATION  Patient ID: Katherine Hanson; [id number]; Aug 10, 1940   Admit date: 05/06/2024 Date of Consult: 05/07/2024  Primary Care Provider: Lonna Millman, DO Primary Cardiologist: Ozell Stands, DO [facility])  HISTORY OF PRESENT ILLNESS  Katherine Hanson is a 83 y.o. female with cardiac history outlined below (patient of [facility]) currently admitted after presenting with weakness and fatigue following a nosebleed at home.  She tells me that she experienced a significant nosebleed on Sunday, fairly prolonged but ultimately subsided however she was feeling more short of breath moving about and came to the ER for further evaluation.  She had seen her nephrologist on Saturday and was noted to be 6 pounds over her prior weight.  She reports compliance with her medications otherwise.  Pro-BNP noted to be 4626 with chest x-ray showing mild pulmonary edema.  Following admission to the floor she became more short of breath and required increasing oxygen supplementation and eventually BiPAP with transfer to the ICU.  Follow-up chest x-ray showed worsening edema rule out infiltrate, she was started on empiric antibiotics and also had Foley catheter placed for additional IV diuresis.  Atrial fibrillation has been present with RVR, heart rate in the 110-120 range.  This morning she is back on nasal cannula oxygen and states that she feels much better.  No recent chest pain or sense of palpitations.  At present Eliquis has been held by primary team.  She is also not back on beta-blocker for heart rate control, at home on Lopressor 100 mg twice daily.  Also typically on Cozaar 25 mg daily, hydralazine 50 mg twice daily, Norvasc 10 mg daily, Lipitor 40 mg daily and Lasix  20 mg twice daily.  She is anemic with hemoglobin down to 8.6, also with AKI in the setting of CKD stage IIIb, current creatinine 2.39 with GFR 20.  High-sensitivity troponin T levels have increased from 180 up to 496.  At  this point suspect demand ischemia rather than frank ACS.  She does have an echocardiogram pending to reevaluate LVEF.  ROS  Pertinent review in history of present illness.  Past Medical History:  Diagnosis Date   (HFpEF) heart failure with preserved ejection fraction (HCC)    Arthritis    Atrial fibrillation (HCC)    CAD (coronary artery disease)    Multivessel disease status post DES to mid LAD 05/2020 [facility]   Carotid artery disease    CKD stage 3b, GFR 30-44 ml/min (HCC)    Hay fever    HTN (hypertension)    Mixed hyperlipidemia    Seasonal allergies     Past Surgical History:  Procedure Laterality Date   ANKLE SURGERY  metal   2008 Dr. Daris right OTIF   cyst removed  breast   TONSILLECTOMY     TOTAL ABDOMINAL HYSTERECTOMY     TOTAL KNEE ARTHROPLASTY  left   Dr. Margrette     INPATIENT MEDICATIONS Scheduled Meds:  Chlorhexidine Gluconate Cloth  6 each Topical Daily   insulin aspart  0-15 Units Subcutaneous TID WC    morphine injection  2 mg Intravenous Once   pantoprazole (PROTONIX) IV  40 mg Intravenous Daily   Continuous Infusions:  furosemide  (LASIX ) 200 mg in dextrose 5 % 100 mL (2 mg/mL) infusion 8 mg/hr (05/06/24 2313)   levofloxacin (LEVAQUIN) IV Stopped (05/06/24 1949)   PRN Meds: acetaminophen  **OR** acetaminophen , ipratropium, oxymetazoline, sodium chloride  ALLERGIES Allergies  Allergen Reactions   Penicillins  SOCIAL HISTORY  Social History   Tobacco Use   Smoking status: Never   Smokeless tobacco: Never  Substance Use Topics   Alcohol use: No    FAMILY HISTORY   The patient's family history includes Arthritis in her unknown relative; Glaucoma in her mother; Hypertension in her mother.  PHYSICAL EXAM & DATA  Vitals:   05/07/24 0753 05/07/24 0814 05/07/24 0831 05/07/24 0906  BP:      Pulse: 97  97 (!) 137  Resp: (!) 23  (!) 23 19  Temp:  (!) 100.5 F (38.1 C)    TempSrc:  Axillary    SpO2: 100%  93% 94%   Weight:      Height:        Intake/Output Summary (Last 24 hours) at 05/07/2024 0924 Last data filed at 05/07/2024 0800 Gross per 24 hour  Intake 417.92 ml  Output 3450 ml  Net -3032.08 ml   Filed Weights   05/06/24 0953 05/07/24 0426  Weight: 115.7 kg 116.3 kg   Body mass index is 45.42 kg/m.   Gen: Patient currently peers comfortable on nasal cannula oxygen. HEENT: Conjunctiva and lids normal, oropharynx clear. Neck: Supple, difficult to assess JVP. Lungs: Clear to auscultation, nonlabored breathing at rest. Cardiac: Irregularly irregular without gallop, soft systolic murmur.. Abdomen: Soft, nontender, bowel sounds present. Extremities: 1+ lower leg edema, distal pulses 2+. Skin: Warm and dry. Musculoskeletal: No kyphosis. Neuropsychiatric: Alert and oriented x3, affect grossly appropriate.  EKG:  An ECG dated 05/06/2024 was personally reviewed today and demonstrated:  Atrial fibrillation with aberrantly conducted complexes, decreased R wave progression rule out old anterior infarct pattern, nonspecific ST changes.  Telemetry:  I personally reviewed telemetry which shows atrial fibrillation.  RELEVANT CV STUDIES  Echocardiogram 04/14/2020: Summary   1. The left ventricle is normal in size with mildly increased wall  thickness.   2. The left ventricular systolic function is normal, LVEF is visually  estimated at 60-65%.    3. There is grade III diastolic dysfunction (severely elevated filling  pressure).   4. The right ventricle is normal in size, with normal systolic function.    5. Pulmonary systolic pressure cannot be estimated due to insufficient TR  jet.   Cardiac catheterization and PCI 05/18/2020 [facility]): Findings:  1. Coronary artery disease including 80% mid LAD, 40% mid LMCA. RCA known  to be occluded (not engaged in current study)  2. Successful PCI to the mid LAD with the placement of a 2.5 x 32 mm  Synergy DES with excellent angiographic  result and TIMI 3 flow.   Carotid Dopplers 04/03/2023 [facility]): Summary of Findings   1. Right CCA demonstrates significant plaque.   2. Right ECA appears stenotic.   3. Right ICA stenosis 50 - 69%.   4. The Left ICA stenosis less than 50%.   LABORATORY DATA  Chemistry Recent Labs  Lab 05/06/24 1150 05/06/24 1957 05/07/24 0529  NA 137 139 140  K 3.0* 4.0 3.8  CL 97* 99 99  CO2 22 26 28   GLUCOSE 140* 155* 133*  BUN 53* 53* 56*  CREATININE 2.04* 2.06* 2.39*  CALCIUM 9.5 9.4 9.4  GFRNONAA 24* 24* 20*  ANIONGAP 18* 14 12    Recent Labs  Lab 05/06/24 1150 05/07/24 0529  PROT 7.4 6.6  ALBUMIN 4.5 3.9  AST 28 32  ALT 21 17  ALKPHOS 114 97  BILITOT 1.2 1.5*   Hematology Recent Labs  Lab 05/06/24 1209  05/07/24 0529  WBC 8.3 12.0*  RBC 3.20* 2.87*  HGB 9.7* 8.6*  HCT 29.6* 26.2*  MCV 92.5 91.3  MCH 30.3 30.0  MCHC 32.8 32.8  RDW 15.7* 15.6*  PLT 163 143*   Cardiac Enzymes Recent Labs  Lab 05/06/24 1740 05/06/24 1957 05/07/24 0522  TRNPT 180* 242* 496*    BNP Recent Labs  Lab 05/06/24 1150  PROBNP 4,626.0*     RADIOLOGY/STUDIES US  EKG SITE RITE Result Date: 05/06/2024 If Site Rite image not attached, placement could not be confirmed due to current cardiac rhythm.  DG CHEST PORT 1 VIEW Result Date: 05/06/2024 EXAM: 1 VIEW(S) XRAY OF THE CHEST 05/06/2024 05:07:00 PM COMPARISON: 05/06/2024 CLINICAL HISTORY: Shortness of breath. FINDINGS: LINES, TUBES AND DEVICES: Multiple wires and leads project over the chest on the frontal radiograph. LUNGS AND PLEURA: Diffuse lower lung predominantly interstitial and airspace disease is increased, especially on the left. Limited evaluation for pleural effusion secondary to patient body habitus. No large pleural effusion. No pneumothorax. HEART AND MEDIASTINUM: Moderate cardiomegaly. BONES AND SOFT TISSUES: No acute osseous abnormality. IMPRESSION: 1. technique degradation is detailed above. 2. Worsened diffuse  interstitial and airspace disease, especially on the left, favoring pulmonary edema.concurrent infection cannot be excluded. Electronically signed by: Rockey Kilts MD 05/06/2024 05:58 PM EST RP Workstation: [id number]   DG Chest Port 1 View Result Date: 05/06/2024 CLINICAL DATA:  Shortness of breath. EXAM: PORTABLE CHEST 1 VIEW COMPARISON:  04/11/2024. FINDINGS: Cardiomegaly, unchanged. Aortic atherosclerosis. Bilateral interstitial opacities, most pronounced at the lung bases, slightly more pronounced compared to the prior exam. No pleural effusion or pneumothorax. No acute osseous abnormality. IMPRESSION: 1. Bilateral interstitial opacities are slightly more pronounced compared to the prior exam and may reflect interstitial pulmonary edema. 2. Cardiomegaly. Electronically Signed   By: Harrietta Sherry M.D.   On: 05/06/2024 11:21    ASSESSMENT & PLAN  1.  Acute hypoxic respiratory failure, chest x-ray showing bilateral interstitial opacities likely reflecting pulmonary edema.  Pro-BNP 4626, follow-up echocardiogram is pending.  She has a history of HFpEF based on prior testing through [facility], LVEF was 60 to 65% with restrictive diastolic filling pattern as of 2021.  Reportedly 6 pounds over typical weight based on visit with nephrologist on Saturday.  She does take Lasix  at home.  2.  Permanent atrial fibrillation per chart review, CHA2DS2-VASc score is 6.  Patient apparently taking Eliquis 5 mg once daily at home, typical dose would be 2.5 mg twice daily based on her age and creatinine.  Heart rate up at this time, however beta-blocker has not yet been resumed.  3.  Recent epistaxis, currently resolved.  Hemoglobin down to 8.6, was 11.4 three weeks ago.  Anemia may be more multifactorial however.  4.  Multivessel CAD by cardiac catheterization at [facility] in 2021 status post DES to the mid LAD.  Elevated high-sensitivity troponin I trend was likely in range to suspect demand ischemia  rather than frank ACS.  5.  CKD stage IIIb with AKI, creatinine up to 2.39 with GFR 20.  She does follow with nephrology as an outpatient.  Currently on Lasix  infusion at 8 mg/h, net urine output of approximately 3000 cc since yesterday, will plan to reduce dose somewhat.  Start Lopressor 50 mg twice daily and follow heart rate control.  Hold off on resuming Cozaar for now.  Reasonable to hold anticoagulation, would also consider heme checking stools.  Follow-up on echocardiogram to reevaluate LVEF as medications may  need to be adjusted further.  At this point do not anticipate a follow-up cardiac catheterization given current comorbidities.  For questions or updates, please contact [facility] Please consult [email or web address] for contact info under   Signed, Jayson Sierras, MD  05/07/2024 9:24 AM

## 2024-05-07 NOTE — Plan of Care (Signed)
  Problem: Education: Goal: Knowledge of General Education information will improve Description: Including pain rating scale, medication(s)/side effects and non-pharmacologic comfort measures Outcome: Progressing   Problem: Clinical Measurements: Goal: Respiratory complications will improve Outcome: Progressing   Problem: Activity: Goal: Risk for activity intolerance will decrease Outcome: Progressing   Problem: Elimination: Goal: Will not experience complications related to urinary retention Outcome: Progressing   Problem: Safety: Goal: Ability to remain free from injury will improve Outcome: Progressing   Problem: Skin Integrity: Goal: Risk for impaired skin integrity will decrease Outcome: Progressing   Problem: Fluid Volume: Goal: Ability to maintain a balanced intake and output will improve Outcome: Progressing

## 2024-05-07 NOTE — Progress Notes (Signed)
 Patient is doing much better tonight respiratory wise. Was able to titrate her O2 down from 9 lpm to 7. BIPAP is on standby at bedside if needed.

## 2024-05-07 NOTE — Plan of Care (Signed)

## 2024-05-08 DIAGNOSIS — I5033 Acute on chronic diastolic (congestive) heart failure: Secondary | ICD-10-CM | POA: Diagnosis not present

## 2024-05-08 DIAGNOSIS — I4891 Unspecified atrial fibrillation: Secondary | ICD-10-CM | POA: Diagnosis not present

## 2024-05-08 LAB — CBC WITH DIFFERENTIAL/PLATELET
Abs Immature Granulocytes: 0.05 K/uL (ref 0.00–0.07)
Basophils Absolute: 0 K/uL (ref 0.0–0.1)
Basophils Relative: 0 %
Eosinophils Absolute: 0.1 K/uL (ref 0.0–0.5)
Eosinophils Relative: 1 %
HCT: 26.7 % — ABNORMAL LOW (ref 36.0–46.0)
Hemoglobin: 8.7 g/dL — ABNORMAL LOW (ref 12.0–15.0)
Immature Granulocytes: 1 %
Lymphocytes Relative: 14 %
Lymphs Abs: 1.5 K/uL (ref 0.7–4.0)
MCH: 29.8 pg (ref 26.0–34.0)
MCHC: 32.6 g/dL (ref 30.0–36.0)
MCV: 91.4 fL (ref 80.0–100.0)
Monocytes Absolute: 1.2 K/uL — ABNORMAL HIGH (ref 0.1–1.0)
Monocytes Relative: 12 %
Neutro Abs: 7.6 K/uL (ref 1.7–7.7)
Neutrophils Relative %: 72 %
Platelets: 148 K/uL — ABNORMAL LOW (ref 150–400)
RBC: 2.92 MIL/uL — ABNORMAL LOW (ref 3.87–5.11)
RDW: 15.4 % (ref 11.5–15.5)
WBC: 10.5 K/uL (ref 4.0–10.5)
nRBC: 0 % (ref 0.0–0.2)

## 2024-05-08 LAB — GLUCOSE, CAPILLARY
Glucose-Capillary: 112 mg/dL — ABNORMAL HIGH (ref 70–99)
Glucose-Capillary: 121 mg/dL — ABNORMAL HIGH (ref 70–99)
Glucose-Capillary: 115 mg/dL — ABNORMAL HIGH (ref 70–99)
Glucose-Capillary: 120 mg/dL — ABNORMAL HIGH (ref 70–99)

## 2024-05-08 LAB — HEMOGLOBIN A1C
Hgb A1c MFr Bld: 6.4 % — ABNORMAL HIGH (ref 4.8–5.6)
Mean Plasma Glucose: 137 mg/dL

## 2024-05-08 LAB — BASIC METABOLIC PANEL WITH GFR
Anion gap: 13 (ref 5–15)
BUN: 59 mg/dL — ABNORMAL HIGH (ref 8–23)
CO2: 31 mmol/L (ref 22–32)
Calcium: 9.3 mg/dL (ref 8.9–10.3)
Chloride: 92 mmol/L — ABNORMAL LOW (ref 98–111)
Creatinine, Ser: 2.46 mg/dL — ABNORMAL HIGH (ref 0.44–1.00)
GFR, Estimated: 19 mL/min — ABNORMAL LOW (ref >60)
Glucose, Bld: 108 mg/dL — ABNORMAL HIGH (ref 70–99)
Potassium: 3 mmol/L — ABNORMAL LOW (ref 3.5–5.1)
Sodium: 136 mmol/L (ref 135–145)

## 2024-05-08 LAB — MAGNESIUM: Magnesium: 2.4 mg/dL (ref 1.7–2.4)

## 2024-05-08 MED ORDER — POTASSIUM CHLORIDE 20 MEQ PO PACK: 40.0000 meq | PACK | Freq: Once | ORAL | Status: DC

## 2024-05-08 MED ORDER — METOPROLOL TARTRATE 50 MG PO TABS
100.0000 mg | ORAL_TABLET | Freq: Two times a day (BID) | ORAL | Status: DC
  Administered 2024-05-08 – 2024-05-09 (×2): 100 mg via ORAL
  Filled 2024-05-08 (×2): qty 2

## 2024-05-08 MED ORDER — METOPROLOL TARTRATE 50 MG PO TABS
50.0000 mg | ORAL_TABLET | Freq: Once | ORAL | Status: AC
  Administered 2024-05-08: 50 mg via ORAL
  Filled 2024-05-08: qty 1

## 2024-05-08 MED ORDER — POTASSIUM CHLORIDE 20 MEQ PO PACK
20.0000 meq | PACK | Freq: Once | ORAL | Status: AC
  Administered 2024-05-08: 20 meq via ORAL
  Filled 2024-05-08: qty 1

## 2024-05-08 MED ORDER — LEVOFLOXACIN IN D5W 500 MG/100ML IV SOLN
500.0000 mg | INTRAVENOUS | Status: DC
  Administered 2024-05-08: 500 mg via INTRAVENOUS
  Filled 2024-05-08: qty 100

## 2024-05-08 MED ORDER — STERILE WATER FOR INJECTION IJ SOLN
INTRAMUSCULAR | Status: AC
  Administered 2024-05-08: 10 mL
  Filled 2024-05-08: qty 10

## 2024-05-08 NOTE — Progress Notes (Addendum)
 1215 Patient ambulated to use bathroom with 2L [place] on with this short distance patient 02 dropped to 84% using a cane remains at 88% while in the toilet. 1650 patient still up in chair at this time will remove foley when patient gets back in bed. Patient is aware.

## 2024-05-08 NOTE — Plan of Care (Signed)
  Problem: Education: Goal: Knowledge of General Education information will improve Description: Including pain rating scale, medication(s)/side effects and non-pharmacologic comfort measures Outcome: Progressing   Problem: Health Behavior/Discharge Planning: Goal: Ability to manage health-related needs will improve Outcome: Progressing   Problem: Clinical Measurements: Goal: Ability to maintain clinical measurements within normal limits will improve Outcome: Progressing Goal: Will remain free from infection Outcome: Progressing Goal: Diagnostic test results will improve Outcome: Progressing Goal: Respiratory complications will improve Outcome: Progressing Goal: Cardiovascular complication will be avoided Outcome: Progressing   Problem: Activity: Goal: Risk for activity intolerance will decrease Outcome: Progressing   Problem: Nutrition: Goal: Adequate nutrition will be maintained Outcome: Progressing   Problem: Coping: Goal: Level of anxiety will decrease Outcome: Progressing   Problem: Elimination: Goal: Will not experience complications related to bowel motility Outcome: Progressing Goal: Will not experience complications related to urinary retention Outcome: Progressing   Problem: Pain Managment: Goal: General experience of comfort will improve and/or be controlled Outcome: Progressing   Problem: Safety: Goal: Ability to remain free from injury will improve Outcome: Progressing   Problem: Skin Integrity: Goal: Risk for impaired skin integrity will decrease Outcome: Progressing   Problem: Education: Goal: Ability to describe self-care measures that may prevent or decrease complications (Diabetes Survival Skills Education) will improve Outcome: Progressing Goal: Individualized Educational Video(s) Outcome: Progressing   Problem: Coping: Goal: Ability to adjust to condition or change in health will improve Outcome: Progressing   Problem: Fluid  Volume: Goal: Ability to maintain a balanced intake and output will improve Outcome: Progressing   Problem: Health Behavior/Discharge Planning: Goal: Ability to identify and utilize available resources and services will improve Outcome: Progressing Goal: Ability to manage health-related needs will improve Outcome: Progressing   Problem: Metabolic: Goal: Ability to maintain appropriate glucose levels will improve Outcome: Progressing   Problem: Nutritional: Goal: Maintenance of adequate nutrition will improve Outcome: Progressing Goal: Progress toward achieving an optimal weight will improve Outcome: Progressing   Problem: Skin Integrity: Goal: Risk for impaired skin integrity will decrease Outcome: Progressing   Problem: Tissue Perfusion: Goal: Adequacy of tissue perfusion will improve Outcome: Progressing   Problem: Education: Goal: Ability to demonstrate management of disease process will improve Outcome: Progressing Goal: Ability to verbalize understanding of medication therapies will improve Outcome: Not Progressing Goal: Individualized Educational Video(s) Outcome: Not Progressing   Problem: Activity: Goal: Capacity to carry out activities will improve Outcome: Progressing   Problem: Cardiac: Goal: Ability to achieve and maintain adequate cardiopulmonary perfusion will improve Outcome: Not Progressing

## 2024-05-08 NOTE — Progress Notes (Signed)
 Progress Note  Patient Name: Katherine Hanson Date of Encounter: 05/08/2024  Primary Cardiologist: Ozell Stands, DO [facility])   Interval Summary  Chart reviewed.,  Breathing comfortably.  No chest pain or palpitations.  Net urine output of approximately 3700 cc last 24 hours.  She has remained on Lasix  infusion.  Vital Signs  Vitals:   05/08/24 0600 05/08/24 0700 05/08/24 0800 05/08/24 0836  BP: (!) 137/59 (!) 140/61 109/89   Pulse: 98 94 (!) 107 (!) 109  Resp: 20 19 18 18   Temp:   99.6 F (37.6 C)   TempSrc:   Axillary   SpO2: 98% 99% 96% 94%  Weight:      Height:        Intake/Output Summary (Last 24 hours) at 05/08/2024 0913 Last data filed at 05/08/2024 0836 Gross per 24 hour  Intake 1646.77 ml  Output 3740 ml  Net -2093.23 ml   Filed Weights   05/06/24 0953 05/07/24 0426 05/08/24 0411  Weight: 115.7 kg 116.3 kg 109.9 kg    Physical Exam  GEN: No acute distress.   Neck: No JVD. Cardiac: RRR, no murmur, rub, or gallop.  Respiratory: Nonlabored. Clear to auscultation bilaterally. GI: Soft, nontender, bowel sounds present. MS: No edema. Neuro:  Nonfocal. Psych: Alert and oriented x 3. Normal affect.  ECG/Telemetry  Telemetry reviewed showing atrial fibrillation, heart rate 100-110.  Labs  Chemistry Recent Labs  Lab 05/06/24 1150 05/06/24 1957 05/07/24 0529 05/08/24 0315  NA 137 139 140 136  K 3.0* 4.0 3.8 3.0*  CL 97* 99 99 92*  CO2 22 26 28 31   GLUCOSE 140* 155* 133* 108*  BUN 53* 53* 56* 59*  CREATININE 2.04* 2.06* 2.39* 2.46*  CALCIUM 9.5 9.4 9.4 9.3  PROT 7.4  --  6.6  --   ALBUMIN 4.5  --  3.9  --   AST 28  --  32  --   ALT 21  --  17  --   ALKPHOS 114  --  97  --   BILITOT 1.2  --  1.5*  --   GFRNONAA 24* 24* 20* 19*  ANIONGAP 18* 14 12 13     Hematology Recent Labs  Lab 05/06/24 1209 05/07/24 0529 05/08/24 0315  WBC 8.3 12.0* 10.5  RBC 3.20* 2.87* 2.92*  HGB 9.7* 8.6* 8.7*  HCT 29.6* 26.2* 26.7*  MCV 92.5 91.3  91.4  MCH 30.3 30.0 29.8  MCHC 32.8 32.8 32.6  RDW 15.7* 15.6* 15.4  PLT 163 143* 148*   Cardiac Enzymes Recent Labs  Lab 05/06/24 1740 05/06/24 1957 05/07/24 0522  TRNPT 180* 242* 496*    Cardiac Studies  Echocardiogram 05/07/2024:  1. Left ventricular ejection fraction, by estimation, is 60 to 65%. The  left ventricle has normal function. The left ventricle has no regional  wall motion abnormalities. There is mild concentric left ventricular  hypertrophy. Left ventricular diastolic  parameters are indeterminate.   2. Right ventricular systolic function is normal. The right ventricular  size is normal. There is mildly elevated pulmonary artery systolic  pressure. The estimated right ventricular systolic pressure is 40.6 mmHg.   3. Left atrial size was moderately dilated.   4. Right atrial size was mildly dilated.   5. The mitral valve is degenerative. Mild to moderate mitral valve  regurgitation.   6. The aortic valve is tricuspid. There is moderate calcification of the  aortic valve. Aortic valve regurgitation is not visualized. Moderate  aortic valve stenosis.  Aortic valve mean gradient measures 22.5 mmHg.  Dimentionless index 0.31.   7. The inferior vena cava is dilated in size with <50% respiratory  variability, suggesting right atrial pressure of 15 mmHg.   Assessment & Plan  1.  HFpEF, acute on chronic exacerbation.  Follow-up echocardiogram shows LVEF 60 to 65% with mild LVH, normal RV contraction and mildly elevated RVSP.  Fluid status improved on Lasix  infusion.  GDMT somewhat limited at this time.  She tells me that she had been on Jardiance as an outpatient, however discontinued by her nephrologist due to transiently worsening renal function with plan to potentially try again.   2.  Permanent atrial fibrillation per chart review, CHA2DS2-VASc score is 6.  Lopressor resumed yesterday and will be uptitrated today for better heart rate control.   3.  Recent  epistaxis, currently resolved.  Follow-up hemoglobin 8.7, was 11.4 three weeks ago.  Anemia may be more multifactorial however.   4.  Multivessel CAD by cardiac catheterization at [facility] in 2021 status post DES to the mid LAD.  Elevated high-sensitivity troponin T trend likely in range to suspect demand ischemia rather than frank ACS.  On Plavix and Lipitor as an outpatient as well.   5.  CKD stage IIIb with AKI, creatinine 2.46 with GFR 19.  She does follow with nephrology as an outpatient.  Plan to stop Lasix  infusion today.  Increase Lopressor to 100 mg twice daily.  Continue to hold Cozaar for now. May be able to consider initiation of Jardiance as an outpatient if renal function stabilizes, might even be a candidate for low dose Kerendia.  If no further workup of anemia planned by primary team, could consider cautious reinitiation of Eliquis at 2.5 mg twice daily.  Not entirely clear that she needs to stay on long-term Plavix at least based on her PCI history (she follows with [facility] cardiology).  Can likely start back on oral Lasix  tomorrow, would transfer her out to telemetry today and increase activity as tolerated.  For questions or updates, please contact  [facility] Please consult [email or web address] for contact info under   Signed, Jayson Sierras, MD  05/08/2024, 9:13 AM

## 2024-05-08 NOTE — Progress Notes (Signed)
 0800 patient alert x4 on 4L [place] decreased to 2L [place]. Patient reports breathing normal with no pain. Patient unsure of all the medications she takes at home with some confusion from what was suppose to be stopped by her primary care. Education on lasix  fluids diet and BNP levels being elevated patient reports she skips lasix  because she urinated a lot, education on the effects of missing that medication reviewed with patient and her daughter at bedside.

## 2024-05-08 NOTE — Progress Notes (Signed)
 PROGRESS NOTE    Katherine Hanson  FMW:[id number] DOB: [birth year] DOA: 05/06/2024 PCP: Lonna Millman, DO   Brief Narrative:  83 y.o. 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  Patient reported of nasal bleeding overnight which she reports was excessive.     Patient is admitted for acute on chronic CHF Patient received IV Lasix  40 mg x 1.  Shortly after arrival to floor, patient became acutely short of breath with worsening hypoxia needing oxygen as high as 2 L/min, she was placed on NRB, ABG was reassuring with pH 7.43, pCO2 42 and O2 72, chest x-ray revealed worsening edema, patient was restless and was placed on BiPAP and transferred to ICU.  She was placed on IV Lasix  drip with good urine output.  After open overnight BiPAP, she is transitioned to nasal cannula 12/2.   Lasix  drip stopped on 05/08/2024.  Patient will be transferred to telemetry floor today.  Assessment & Plan:  Principal Problem:   Acute exacerbation of CHF (congestive heart failure) (HCC) Active Problems:   MGUS (monoclonal gammopathy of unknown significance)   CKD (chronic kidney disease) stage 4, GFR 15-29 ml/min (HCC)   Normocytic anemia   Essential hypertension   (HFpEF) heart failure with preserved ejection fraction (HCC)   Pulmonary edema   Acute on chronic heart failure with preserved ejection fraction, POA:: Flash pulmonary edema   - Patient was started on Lasix  drip which was stopped on 05/08/2024.  Patient has good urine output and respiratory symptoms are significantly improved as well -History of CAD with stent, on Plavix currently on hold because of epistaxis I personally reviewed the transthoracic echocardiogram which showed ejection fraction of 60 to 65% with moderate aortic stenosis. Patient follows up  with [facility] cardiology Continue with Lopressor 100 mg p.o. twice daily Hold SGLT2 Cardiology consulted, I discussed the case with cardiologist, Dr. Debera this morning and the plan is to stop the Lasix  drip, continue with oral metoprolol and start oral Lasix  tomorrow.   Acute hypoxic respiratory failure: Improving now Respiratory status acutely worsened after arrival to floor from ER.  Chest x-ray reveals worsening pulmonary edema, less likely infection.  IV Levaquin was started given worsening pulmonary opacities however likelihood of infection is low Patient was initially placed on BiPAP.  Off of BiPAP since 05/07/2024.  Currently, she is needing 2 L oxygen to maintain oxygen saturation between 88 to 92%.  She will need a 6-minute walk test on discharge.    History of CAD with stent Non-STEMI type II: Troponin T elevated and rising, demand ischemia versus plaque rupture.   Patient initiated on IV heparin however this is discontinued due to drop in hemoglobin.  No evidence of active bleeding.  Of note patient had epistaxis at home that resolved prior to presentation to the hospital   Permanent A-fib with RVR: Continue metoprolol. Takes Eliquis at home, currently anticoagulated on hold due to drop in hemoglobin If no evidence of active bleeding, will restart Eliquis at 2.5 mg p.o. twice daily on 05/09/2024.   Epistaxis: Resolved.  Both Eliquis and Plavix are on hold.   Acute on chronic anemia: Baseline hemoglobin 11, on admission 9.7, dropped to 8.6. No acute evidence of bleeding.  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  She is likely a candidate for antiobesity medications to be initiated as an outpatient because of multiple obesity related comorbidities.  Disposition: Patient lives at  home by herself and is independent in activities of daily life.  She will need a 6-minute walk test on discharge.   DVT prophylaxis: SCDs Start: 05/06/24 1349     Code Status: Full Code Family Communication:   Status is: Inpatient Remains inpatient appropriate because: Acute CHF    Subjective:  Feels much better and wanted to know when can she be discharged. She is off of BIPAP since yesterday. She is on 2 L [place] now. Spoke to her daughter at the bedside as well and cardiologist, Dr Debera.  Examination:  General exam: Appears calm and comfortable , nasal oxygen in place Respiratory system: Clear to auscultation. Respiratory effort normal. Cardiovascular system: S1 & S2 heard, RRR. No JVD, murmurs, rubs, gallops or clicks. No pedal edema. Gastrointestinal system: Abdomen is nondistended, soft and nontender. No organomegaly or masses felt. Normal bowel sounds heard. Central nervous system: Alert and oriented. No focal neurological deficits. Extremities: Symmetric 5 x 5 power. Skin: No rashes, lesions or ulcers Psychiatry: Judgement and insight appear normal. Mood & affect appropriate.     Diet Orders (From admission, onward)     Start     Ordered   05/06/24 1352  Diet 2 gram sodium Room service appropriate? Yes; Fluid consistency: Thin; Fluid restriction: 2000 mL Fluid  Diet effective now       Question Answer Comment  Room service appropriate? Yes   Fluid consistency: Thin   Fluid restriction: 2000 mL Fluid      05/06/24 1352            Objective: Vitals:   05/08/24 0800 05/08/24 0836 05/08/24 0900 05/08/24 0914  BP: 109/89  124/77 124/77  Pulse: (!) 107 (!) 109 (!) 103 (!) 114  Resp: 18 18 15 14   Temp: 99.6 F (37.6 C)     TempSrc: Axillary     SpO2: 96% 94% 91% 93%  Weight:      Height:        Intake/Output Summary (Last 24 hours) at 05/08/2024 0948 Last data filed at 05/08/2024 0900 Gross per 24 hour  Intake 1648.78 ml  Output 3740 ml  Net -2091.22  ml   Filed Weights   05/06/24 0953 05/07/24 0426 05/08/24 0411  Weight: 115.7 kg 116.3 kg 109.9 kg    Scheduled Meds:  Chlorhexidine Gluconate Cloth  6 each Topical Daily   insulin aspart  0-15 Units Subcutaneous TID WC   metoprolol tartrate  100 mg Oral BID   metoprolol tartrate  50 mg Oral Once    morphine injection  2 mg Intravenous Once   pantoprazole (PROTONIX) IV  40 mg Intravenous Daily   potassium chloride   20 mEq Oral BID   potassium chloride   20 mEq Oral Once   Continuous Infusions:  levofloxacin (LEVAQUIN) IV Stopped (05/06/24 1949)    Nutritional status     Body mass index is 42.92 kg/m.  Data Reviewed:   CBC: Recent Labs  Lab 05/06/24 1209 05/07/24 0529 05/08/24 0315  WBC 8.3 12.0* 10.5  NEUTROABS 6.7  --  7.6  HGB 9.7* 8.6* 8.7*  HCT 29.6* 26.2* 26.7*  MCV 92.5 91.3 91.4  PLT 163 143* 148*   Basic Metabolic Panel: Recent Labs  Lab 05/06/24 1150 05/06/24 1957 05/07/24 0522 05/07/24 0529 05/08/24 0315  NA 137 139  --  140 136  K 3.0* 4.0  --  3.8 3.0*  CL 97* 99  --  99 92*  CO2 22 26  --  28 31  GLUCOSE 140* 155*  --  133* 108*  BUN 53* 53*  --  56* 59*  CREATININE 2.04* 2.06*  --  2.39* 2.46*  CALCIUM 9.5 9.4  --  9.4 9.3  MG 2.5*  --  2.2  --  2.4   GFR: Estimated Creatinine Clearance: 21 mL/min (A) (by C-G formula based on SCr of 2.46 mg/dL (H)). Liver Function Tests: Recent Labs  Lab 05/06/24 1150 05/07/24 0529  AST 28 32  ALT 21 17  ALKPHOS 114 97  BILITOT 1.2 1.5*  PROT 7.4 6.6  ALBUMIN 4.5 3.9   No results for input(s): LIPASE, AMYLASE in the last 168 hours. No results for input(s): AMMONIA in the last 168 hours. Coagulation Profile: Recent Labs  Lab 05/06/24 1150  INR 1.5*   Cardiac Enzymes: No results for input(s): CKTOTAL, CKMB, CKMBINDEX, TROPONINI in the last 168 hours. BNP (last 3 results) Recent Labs    04/11/24 1147 05/06/24 1150  PROBNP 2,887.0* 4,626.0*   HbA1C: Recent Labs     05/06/24 1209  HGBA1C 6.4*   CBG: Recent Labs  Lab 05/07/24 0813 05/07/24 1218 05/07/24 1637 05/07/24 2104 05/08/24 0834  GLUCAP 134* 176* 126* 131* 112*   Lipid Profile: No results for input(s): CHOL, HDL, LDLCALC, TRIG, CHOLHDL, LDLDIRECT in the last 72 hours. Thyroid Function Tests: No results for input(s): TSH, T4TOTAL, FREET4, T3FREE, THYROIDAB in the last 72 hours. Anemia Panel: No results for input(s): VITAMINB12, FOLATE, FERRITIN, TIBC, IRON, RETICCTPCT in the last 72 hours. Sepsis Labs: Recent Labs  Lab 05/06/24 1743  PROCALCITON <0.10    Recent Results (from the past 240 hours)  Resp panel by RT-PCR (RSV, Flu A&B, Covid) Anterior Nasal Swab     Status: None   Collection Time: 05/06/24 10:27 AM   Specimen: Anterior Nasal Swab  Result Value Ref Range Status   SARS Coronavirus 2 by RT PCR NEGATIVE NEGATIVE Final    Comment: (NOTE) SARS-CoV-2 target nucleic acids are NOT DETECTED.  The SARS-CoV-2 RNA is generally detectable in upper respiratory specimens during the acute phase of infection. The lowest concentration of SARS-CoV-2 viral copies this assay can detect is 138 copies/mL. A negative result does not preclude SARS-Cov-2 infection and should not be used as the sole basis for treatment or other patient management decisions. A negative result may occur with  improper specimen collection/handling, submission of specimen other than nasopharyngeal swab, presence of viral mutation(s) within the areas targeted by this assay, and inadequate number of viral copies(<138 copies/mL). A negative result must be combined with clinical observations, patient history, and epidemiological information. The expected result is Negative.  Fact Sheet for Patients:  bloggercourse.com  Fact Sheet for Healthcare Providers:  seriousbroker.it  This test is no t yet approved or cleared by the  [place]  FDA and  has been authorized for detection and/or diagnosis of SARS-CoV-2 by FDA under an Emergency Use Authorization (EUA). This EUA will remain  in effect (meaning this test can be used) for the duration of the COVID-19 declaration under Section 564(b)(1) of the Act, 21 U.S.C.section 360bbb-3(b)(1), unless the authorization is terminated  or revoked sooner.       Influenza A by PCR NEGATIVE NEGATIVE Final  Influenza B by PCR NEGATIVE NEGATIVE Final    Comment: (NOTE) The Xpert Xpress SARS-CoV-2/FLU/RSV plus assay is intended as an aid in the diagnosis of influenza from Nasopharyngeal swab specimens and should not be used as a sole basis for treatment. Nasal washings and aspirates are unacceptable for Xpert Xpress SARS-CoV-2/FLU/RSV testing.  Fact Sheet for Patients: bloggercourse.com  Fact Sheet for Healthcare Providers: seriousbroker.it  This test is not yet approved or cleared by the [place]  FDA and has been authorized for detection and/or diagnosis of SARS-CoV-2 by FDA under an Emergency Use Authorization (EUA). This EUA will remain in effect (meaning this test can be used) for the duration of the COVID-19 declaration under Section 564(b)(1) of the Act, 21 U.S.C. section 360bbb-3(b)(1), unless the authorization is terminated or revoked.     Resp Syncytial Virus by PCR NEGATIVE NEGATIVE Final    Comment: (NOTE) Fact Sheet for Patients: bloggercourse.com  Fact Sheet for Healthcare Providers: seriousbroker.it  This test is not yet approved or cleared by the [place]  FDA and has been authorized for detection and/or diagnosis of SARS-CoV-2 by FDA under an Emergency Use Authorization (EUA). This EUA will remain in effect (meaning this test can be used) for the duration of the COVID-19 declaration under Section 564(b)(1) of the Act, 21 U.S.C. section  360bbb-3(b)(1), unless the authorization is terminated or revoked.  Performed at [facility], [address] [place], [place] [address]   MRSA Next Gen by PCR, Nasal     Status: None   Collection Time: 05/06/24  6:23 PM   Specimen: Nasal Mucosa; Nasal Swab  Result Value Ref Range Status   MRSA by PCR Next Gen NOT DETECTED NOT DETECTED Final    Comment: (NOTE) The GeneXpert MRSA Assay (FDA approved for NASAL specimens only), is one component of a comprehensive MRSA colonization surveillance program. It is not intended to diagnose MRSA infection nor to guide or monitor treatment for MRSA infections. Test performance is not FDA approved in patients less than 63 years old. Performed at [facility], [address], [place], [place] [address]   Culture, blood (Routine X 2) w Reflex to ID Panel     Status: None (Preliminary result)   Collection Time: 05/07/24 10:44 AM   Specimen: BLOOD  Result Value Ref Range Status   Specimen Description BLOOD BLOOD RIGHT HAND  Final   Special Requests   Final    BOTTLES DRAWN AEROBIC AND ANAEROBIC Blood Culture adequate volume   Culture   Final    NO GROWTH < 24 HOURS Performed at [facility], [address], [place], [place] [address]    Report Status PENDING  Incomplete  Culture, blood (Routine X 2) w Reflex to ID Panel     Status: None (Preliminary result)   Collection Time: 05/07/24 10:44 AM   Specimen: BLOOD  Result Value Ref Range Status   Specimen Description BLOOD BLOOD LEFT HAND  Final   Special Requests   Final    BOTTLES DRAWN AEROBIC AND ANAEROBIC Blood Culture adequate volume   Culture   Final    NO GROWTH < 24 HOURS Performed at [facility], [address] [place], [place] [address]    Report Status PENDING  Incomplete         Radiology Studies: ECHOCARDIOGRAM COMPLETE Result Date: 05/07/2024    ECHOCARDIOGRAM REPORT   Patient Name:   Katherine Hanson Date of Exam: 05/07/2024 Medical Rec #:  [id number]       Height:  63.0 in Accession #:    [id number]      Weight:       256.4 lb Date of Birth:  Nov 26, 1940       BSA:          2.149 m Patient Age:    83 years        BP:           107/74 mmHg Patient Gender: F               HR:           97 bpm. Exam Location:  [facility] Procedure: 2D Echo, Cardiac Doppler and Color Doppler (Both Spectral and Color            Flow Doppler were utilized during procedure). Indications:    Congestive Heart Failure I50.9  History:        Patient has no prior history of Echocardiogram examinations.                 CHF, CAD, Arrythmias:Atrial Fibrillation; Risk                 Factors:Hypertension, Dyslipidemia and CKD.  Sonographer:    Aida Pizza RCS Referring Phys: [id number] SANTOSH SIGDEL IMPRESSIONS  1. Left ventricular ejection fraction, by estimation, is 60 to 65%. The left ventricle has normal function. The left ventricle has no regional wall motion abnormalities. There is mild concentric left ventricular hypertrophy. Left ventricular diastolic parameters are indeterminate.  2. Right ventricular systolic function is normal. The right ventricular size is normal. There is mildly elevated pulmonary artery systolic pressure. The estimated right ventricular systolic pressure is 40.6 mmHg.  3. Left atrial size was moderately dilated.  4. Right atrial size was mildly dilated.  5. The mitral valve is degenerative. Mild to moderate mitral valve regurgitation.  6. The aortic valve is tricuspid. There is moderate calcification of the aortic valve. Aortic valve regurgitation is not visualized. Moderate aortic valve stenosis. Aortic valve mean gradient measures 22.5 mmHg. Dimentionless index 0.31.  7. The inferior vena cava is dilated in size with <50% respiratory variability, suggesting right atrial pressure of 15 mmHg. Comparison(s): Prior images unable to be directly viewed. FINDINGS  Left Ventricle: Left ventricular ejection fraction, by estimation, is 60 to 65%. The left ventricle has normal function.  The left ventricle has no regional wall motion abnormalities. The left ventricular internal cavity size was normal in size. There is  mild concentric left ventricular hypertrophy. Left ventricular diastolic function could not be evaluated due to atrial fibrillation. Left ventricular diastolic parameters are indeterminate. Right Ventricle: The right ventricular size is normal. No increase in right ventricular wall thickness. Right ventricular systolic function is normal. There is mildly elevated pulmonary artery systolic pressure. The tricuspid regurgitant velocity is 2.53  m/s, and with an assumed right atrial pressure of 15 mmHg, the estimated right ventricular systolic pressure is 40.6 mmHg. Left Atrium: Left atrial size was moderately dilated. Right Atrium: Right atrial size was mildly dilated. Pericardium: There is no evidence of pericardial effusion. Presence of epicardial fat layer. Mitral Valve: The mitral valve is degenerative in appearance. Mild to moderate mitral annular calcification. Mild to moderate mitral valve regurgitation. Tricuspid Valve: The tricuspid valve is grossly normal. Tricuspid valve regurgitation is mild. Aortic Valve: The aortic valve is tricuspid. There is moderate calcification of the aortic valve. There is mild aortic valve annular calcification. Aortic valve regurgitation is not visualized. Moderate aortic stenosis is present. Aortic valve  mean gradient measures 22.5 mmHg. Aortic valve peak gradient measures 36.8 mmHg. Aortic valve area, by VTI measures 0.61 cm. Pulmonic Valve: The pulmonic valve was grossly normal. Pulmonic valve regurgitation is mild. Aorta: The aortic root is normal in size and structure. Venous: The inferior vena cava is dilated in size with less than 50% respiratory variability, suggesting right atrial pressure of 15 mmHg. IAS/Shunts: No atrial level shunt detected by color flow Doppler. Additional Comments: 3D was performed not requiring image post processing  on an independent workstation and was indeterminate.  LEFT VENTRICLE PLAX 2D LVIDd:         4.80 cm LVIDs:         2.90 cm LV PW:         1.20 cm LV IVS:        1.30 cm LVOT diam:     1.60 cm LV SV:         34 LV SV Index:   16 LVOT Area:     2.01 cm  RIGHT VENTRICLE RV S prime:     12.60 cm/s TAPSE (M-mode): 1.6 cm LEFT ATRIUM              Index        RIGHT ATRIUM           Index LA diam:        5.30 cm  2.47 cm/m   RA Area:     20.90 cm LA Vol (A2C):   121.0 ml 56.31 ml/m  RA Volume:   67.10 ml  31.22 ml/m LA Vol (A4C):   85.3 ml  39.69 ml/m LA Biplane Vol: 106.0 ml 49.33 ml/m  AORTIC VALVE AV Area (Vmax):    0.62 cm AV Area (Vmean):   0.67 cm AV Area (VTI):     0.61 cm AV Vmax:           303.50 cm/s AV Vmean:          221.250 cm/s AV VTI:            0.560 m AV Peak Grad:      36.8 mmHg AV Mean Grad:      22.5 mmHg LVOT Vmax:         94.30 cm/s LVOT Vmean:        73.900 cm/s LVOT VTI:          0.171 m LVOT/AV VTI ratio: 0.31  AORTA Ao Root diam: 3.10 cm MITRAL VALVE                TRICUSPID VALVE MV Area (PHT): 3.37 cm     TR Peak grad:   25.6 mmHg MV Decel Time: 225 msec     TR Vmax:        253.00 cm/s MR Peak grad: 98.8 mmHg MR Mean grad: 73.0 mmHg     SHUNTS MR Vmax:      497.00 cm/s   Systemic VTI:  0.17 m MR Vmean:     412.0 cm/s    Systemic Diam: 1.60 cm MV E velocity: 197.00 cm/s Jayson Sierras MD Electronically signed by Jayson Sierras MD Signature Date/Time: 05/07/2024/4:30:06 PM    Final    US  EKG SITE RITE Result Date: 05/06/2024 If Site Rite image not attached, placement could not be confirmed due to current cardiac rhythm.  DG CHEST PORT 1 VIEW Result Date: 05/06/2024 EXAM: 1 VIEW(S) XRAY OF THE CHEST 05/06/2024 05:07:00 PM COMPARISON: 05/06/2024 CLINICAL HISTORY: Shortness of breath. FINDINGS:  LINES, TUBES AND DEVICES: Multiple wires and leads project over the chest on the frontal radiograph. LUNGS AND PLEURA: Diffuse lower lung predominantly interstitial and airspace disease is  increased, especially on the left. Limited evaluation for pleural effusion secondary to patient body habitus. No large pleural effusion. No pneumothorax. HEART AND MEDIASTINUM: Moderate cardiomegaly. BONES AND SOFT TISSUES: No acute osseous abnormality. IMPRESSION: 1. technique degradation is detailed above. 2. Worsened diffuse interstitial and airspace disease, especially on the left, favoring pulmonary edema.concurrent infection cannot be excluded. Electronically signed by: Rockey Kilts MD 05/06/2024 05:58 PM EST RP Workstation: [id number]   DG Chest Port 1 View Result Date: 05/06/2024 CLINICAL DATA:  Shortness of breath. EXAM: PORTABLE CHEST 1 VIEW COMPARISON:  04/11/2024. FINDINGS: Cardiomegaly, unchanged. Aortic atherosclerosis. Bilateral interstitial opacities, most pronounced at the lung bases, slightly more pronounced compared to the prior exam. No pleural effusion or pneumothorax. No acute osseous abnormality. IMPRESSION: 1. Bilateral interstitial opacities are slightly more pronounced compared to the prior exam and may reflect interstitial pulmonary edema. 2. Cardiomegaly. Electronically Signed   By: Harrietta Sherry M.D.   On: 05/06/2024 11:21           LOS: 2 days   Time spent= 35 mins    Deliliah Room, MD [facility]  If 7PM-7AM, please contact night-coverage  05/08/2024, 9:48 AM

## 2024-05-09 DIAGNOSIS — I5030 Unspecified diastolic (congestive) heart failure: Secondary | ICD-10-CM | POA: Diagnosis not present

## 2024-05-09 DIAGNOSIS — I5033 Acute on chronic diastolic (congestive) heart failure: Secondary | ICD-10-CM | POA: Diagnosis not present

## 2024-05-09 LAB — CBC WITH DIFFERENTIAL/PLATELET
Abs Immature Granulocytes: 0.03 K/uL (ref 0.00–0.07)
Basophils Absolute: 0 K/uL (ref 0.0–0.1)
Basophils Relative: 0 %
Eosinophils Absolute: 0.3 K/uL (ref 0.0–0.5)
Eosinophils Relative: 4 %
HCT: 28.8 % — ABNORMAL LOW (ref 36.0–46.0)
Hemoglobin: 9.4 g/dL — ABNORMAL LOW (ref 12.0–15.0)
Immature Granulocytes: 0 %
Lymphocytes Relative: 14 %
Lymphs Abs: 1.2 K/uL (ref 0.7–4.0)
MCH: 30 pg (ref 26.0–34.0)
MCHC: 32.6 g/dL (ref 30.0–36.0)
MCV: 92 fL (ref 80.0–100.0)
Monocytes Absolute: 1 K/uL (ref 0.1–1.0)
Monocytes Relative: 10 %
Neutro Abs: 6.6 K/uL (ref 1.7–7.7)
Neutrophils Relative %: 72 %
Platelets: 176 K/uL (ref 150–400)
RBC: 3.13 MIL/uL — ABNORMAL LOW (ref 3.87–5.11)
RDW: 15.1 % (ref 11.5–15.5)
WBC: 9.1 K/uL (ref 4.0–10.5)
nRBC: 0 % (ref 0.0–0.2)

## 2024-05-09 LAB — BASIC METABOLIC PANEL WITH GFR
Anion gap: 18 — ABNORMAL HIGH (ref 5–15)
BUN: 66 mg/dL — ABNORMAL HIGH (ref 8–23)
CO2: 28 mmol/L (ref 22–32)
Calcium: 9.8 mg/dL (ref 8.9–10.3)
Chloride: 91 mmol/L — ABNORMAL LOW (ref 98–111)
Creatinine, Ser: 2.56 mg/dL — ABNORMAL HIGH (ref 0.44–1.00)
GFR, Estimated: 18 mL/min — ABNORMAL LOW (ref >60)
Glucose, Bld: 123 mg/dL — ABNORMAL HIGH (ref 70–99)
Potassium: 3.4 mmol/L — ABNORMAL LOW (ref 3.5–5.1)
Sodium: 137 mmol/L (ref 135–145)

## 2024-05-09 LAB — GLUCOSE, CAPILLARY: Glucose-Capillary: 131 mg/dL — ABNORMAL HIGH (ref 70–99)

## 2024-05-09 MED ORDER — POTASSIUM CHLORIDE CRYS ER 20 MEQ PO TBCR: 20.0000 meq | EXTENDED_RELEASE_TABLET | Freq: Every day | ORAL | 0 refills | Status: AC

## 2024-05-09 MED ORDER — APIXABAN 2.5 MG PO TABS: 2.5000 mg | ORAL_TABLET | Freq: Two times a day (BID) | ORAL | 0 refills | Status: AC

## 2024-05-09 MED ORDER — FUROSEMIDE 40 MG PO TABS: 40.0000 mg | ORAL_TABLET | Freq: Every day | ORAL | 0 refills | Status: AC

## 2024-05-09 MED ORDER — APIXABAN 2.5 MG PO TABS
2.5000 mg | ORAL_TABLET | Freq: Two times a day (BID) | ORAL | Status: DC
  Administered 2024-05-09: 2.5 mg via ORAL
  Filled 2024-05-09: qty 1

## 2024-05-09 NOTE — TOC Transition Note (Signed)
 Transition of Care [facility]) - Discharge Note   Patient Details  Name: Katherine Hanson MRN: [id number] Date of Birth: 25-Sep-1940  [facility] CM/SW Contact:  Hoy DELENA Bigness, LCSW Phone Number: 05/09/2024, 10:45 AM   Clinical Narrative:    Pt/daughter agreeable to recommendation for [facility] and do not have an agency preference. Referrals sent out and [facility] accepted for HHPT. HH order is in place. Pt does not currently qualify for home oxygen at this time.    Final next level of care: Home w Home Health Services Barriers to Discharge: Barriers Resolved   Patient Goals and CMS Choice Patient states their goals for this hospitalization and ongoing recovery are:: To return home CMS Medicare.gov Compare Post Acute Care list provided to:: Patient Choice offered to / list presented to : Patient      Discharge Placement                       Discharge Plan and Services Additional resources added to the After Visit Summary for                  DME Arranged: N/A DME Agency: NA       HH Arranged: PT HH Agency: [facility] Date [facility] Agency Contacted: 05/09/24 Time HH Agency Contacted: [phone]    Social Drivers of Health (SDOH) Interventions SDOH Screenings   Food Insecurity: No Food Insecurity (05/06/2024)  Housing: Low Risk  (05/06/2024)  Transportation Needs: No Transportation Needs (05/06/2024)  Utilities: Not At Risk (05/06/2024)  Depression (PHQ2-9): Low Risk  (01/03/2024)  Social Connections: Moderately Integrated (05/06/2024)  Tobacco Use: Low Risk  (05/06/2024)  Health Literacy: Medium Risk (06/26/2022)   Received from [facility]     Readmission Risk Interventions     No data to display

## 2024-05-09 NOTE — Discharge Summary (Addendum)
 Physician Discharge Summary   Patient: Katherine Hanson MRN: [id number] DOB: Jun 11, 1940  Admit date:     05/06/2024  Discharge date: 05/09/24  Discharge Physician: Deliliah Room   PCP: Lonna Millman, DO   Recommendations at discharge:    F/u with your PCP in one week F/u with your nephrology and cardiology on the scheduled appointments. BMP in one week Check your Heart rate and BP daily Return to the ED if you develop chest pain, fever, shortness of breath or bleeding from any site.  Discharge Diagnoses: Principal Problem:   Acute exacerbation of CHF (congestive heart failure) (HCC) Active Problems:   MGUS (monoclonal gammopathy of unknown significance)   CKD (chronic kidney disease) stage 4, GFR 15-29 ml/min (HCC)   Normocytic anemia   Essential hypertension   (HFpEF) heart failure with preserved ejection fraction [facility])   Pulmonary edema   Hospital Course:  83 y.o. 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   Acute on chronic heart failure with preserved ejection fraction, POA:: Flash pulmonary edema   - Patient was started on Lasix  drip which was stopped on 05/08/2024.  Patient has good urine output and respiratory symptoms are significantly improved as well I personally reviewed the transthoracic echocardiogram which showed ejection fraction of 60 to 65% with moderate aortic stenosis. Patient follows up with [facility] cardiology Continue with Lopressor 100 mg p.o. twice daily Hold SGLT2 Cardiology consulted, I discussed the case with cardiologist, Dr. Vina Gull on the day of the discharge and she was advised to take lasix  40 mg daily along with KCL 20 meq.    Acute hypoxic respiratory failure: Resolved  Patient was initially placed on BiPAP.  Off of BiPAP since 05/07/2024.    done on the day of the discharge and she was not requiring any supplemental oxygen.     History of CAD with stent: Discussed with Dr Vina Gull on 12/4 and dced plavix as there is no added benefit of it with eliquis in the setting of moderate CV disease.  Non-STEMI type II: Troponin T elevated and rising, demand ischemia versus plaque rupture.   Patient initiated on IV heparin however this is discontinued due to drop in hemoglobin.  No evidence of active bleeding.  Of note patient had epistaxis at home that resolved prior to presentation to the hospital   Permanent A-fib with RVR: Continue metoprolol 100 mg PO BID. Restarted Eliquis at 2.5 mg p.o. twice daily on 05/09/2024.   Epistaxis: Resolved.   Acute on chronic anemia: No evidence of acute bleeding   Hypertension, hyperlipidemia: Hold losartan   CKD stage IV: - Monitor BMP closely while on diuretics. BMP in one week -Outpatient follow up with [facility], Dr Victorio.   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  She is likely a candidate for antiobesity medications to be initiated as an outpatient because of multiple obesity related comorbidities.   Disposition: Patient lives at home by herself and is independent in activities of daily life. HHPT arranged.      Consultants: Cardiology Procedures performed: None  Disposition: Home health Diet recommendation:  Cardiac diet DISCHARGE MEDICATION: Allergies as of 05/09/2024       Reactions   Penicillins  Medication List     STOP taking these medications    amLODipine 10 MG tablet Commonly known as: NORVASC   hydrALAZINE 50 MG tablet Commonly known as: APRESOLINE   losartan 25 MG tablet Commonly known as: COZAAR       TAKE these medications    allopurinol 300 MG tablet Commonly known as: ZYLOPRIM Take 300 mg by mouth daily.   apixaban  2.5 MG Tabs tablet Commonly known as:  ELIQUIS  Take 1 tablet (2.5 mg total) by mouth 2 (two) times daily. What changed:  medication strength how much to take when to take this   atorvastatin 40 MG tablet Commonly known as: LIPITOR Take 20 mg by mouth daily.   fluticasone 50 MCG/ACT nasal spray Commonly known as: FLONASE   furosemide  40 MG tablet Commonly known as: Lasix  Take 1 tablet (40 mg total) by mouth daily. What changed:  medication strength how much to take   latanoprost 0.005 % ophthalmic solution Commonly known as: XALATAN 1 drop at bedtime.   Lumigan 0.01 % Soln Generic drug: bimatoprost Place 1 drop into both eyes at bedtime.   metoprolol  tartrate 100 MG tablet Commonly known as: LOPRESSOR  Take 100 mg by mouth 2 (two) times daily.   potassium chloride  SA 20 MEQ tablet Commonly known as: KLOR-CON  M Take 1 tablet (20 mEq total) by mouth daily.   triamcinolone cream 0.1 % Commonly known as: KENALOG Apply 1 Application topically 3 (three) times daily.   Vitamin D (Ergocalciferol) 1.25 MG (50000 UNIT) Caps capsule Commonly known as: DRISDOL Take by mouth.        Follow-up Information     Lonna Millman, DO. Schedule an appointment as soon as possible for a visit in 1 week(s).   Specialty: Family Medicine Contact information: [address] [place] [address] [phone]         [facility], [place]. Schedule an appointment as soon as possible for a visit in 1 month(s).   Contact information: [address] [place] [address] [phone]         Watt Manna, MD. Schedule an appointment as soon as possible for a visit in 1 week(s).   Specialty: Internal Medicine Contact information: [address] [place] [address] [phone]                Discharge Exam: Filed Weights   05/06/24 0953 05/07/24 0426 05/08/24 0411  Weight: 115.7 kg 116.3 kg 109.9 kg   Constitutional: NAD, calm, comfortable Eyes: PERRL, lids and conjunctivae  normal ENMT: Mucous membranes are moist. Posterior pharynx clear of any exudate or lesions.Normal dentition.  Neck: normal, supple, no masses, no thyromegaly Respiratory: clear to auscultation bilaterally, no wheezing, no crackles. Normal respiratory effort. No accessory muscle use.  Cardiovascular: Regular rate and rhythm, no murmurs / rubs / gallops. No extremity edema. 2+ pedal pulses. No carotid bruits.  Abdomen: no tenderness, no masses palpated. No hepatosplenomegaly. Bowel sounds positive.  Musculoskeletal: no clubbing / cyanosis. No joint deformity upper and lower extremities. Good ROM, no contractures. Normal muscle tone.  Skin: no rashes, lesions, ulcers. No induration Neurologic: CN 2-12 grossly intact. Sensation intact, DTR normal. Strength 5/5 x all 4 extremities.  Psychiatric: Normal judgment and insight. Alert and oriented x 3. Normal mood.    Condition at discharge: good  The results of significant diagnostics from this hospitalization (including imaging, microbiology, ancillary and laboratory) are listed below for reference.   Imaging Studies: ECHOCARDIOGRAM COMPLETE Result Date:  05/07/2024    ECHOCARDIOGRAM REPORT   Patient Name:   Katherine Hanson Date of Exam: 05/07/2024 Medical Rec #:  [id number]       Height:       63.0 in Accession #:    [id number]      Weight:       256.4 lb Date of Birth:  1940-12-11       BSA:          2.149 m Patient Age:    83 years        BP:           107/74 mmHg Patient Gender: F               HR:           97 bpm. Exam Location:  [facility] Procedure: 2D Echo, Cardiac Doppler and Color Doppler (Both Spectral and Color            Flow Doppler were utilized during procedure). Indications:    Congestive Heart Failure I50.9  History:        Patient has no prior history of Echocardiogram examinations.                 CHF, CAD, Arrythmias:Atrial Fibrillation; Risk                 Factors:Hypertension, Dyslipidemia and CKD.  Sonographer:    Aida Pizza RCS  Referring Phys: [id number] SANTOSH SIGDEL IMPRESSIONS  1. Left ventricular ejection fraction, by estimation, is 60 to 65%. The left ventricle has normal function. The left ventricle has no regional wall motion abnormalities. There is mild concentric left ventricular hypertrophy. Left ventricular diastolic parameters are indeterminate.  2. Right ventricular systolic function is normal. The right ventricular size is normal. There is mildly elevated pulmonary artery systolic pressure. The estimated right ventricular systolic pressure is 40.6 mmHg.  3. Left atrial size was moderately dilated.  4. Right atrial size was mildly dilated.  5. The mitral valve is degenerative. Mild to moderate mitral valve regurgitation.  6. The aortic valve is tricuspid. There is moderate calcification of the aortic valve. Aortic valve regurgitation is not visualized. Moderate aortic valve stenosis. Aortic valve mean gradient measures 22.5 mmHg. Dimentionless index 0.31.  7. The inferior vena cava is dilated in size with <50% respiratory variability, suggesting right atrial pressure of 15 mmHg. Comparison(s): Prior images unable to be directly viewed. FINDINGS  Left Ventricle: Left ventricular ejection fraction, by estimation, is 60 to 65%. The left ventricle has normal function. The left ventricle has no regional wall motion abnormalities. The left ventricular internal cavity size was normal in size. There is  mild concentric left ventricular hypertrophy. Left ventricular diastolic function could not be evaluated due to atrial fibrillation. Left ventricular diastolic parameters are indeterminate. Right Ventricle: The right ventricular size is normal. No increase in right ventricular wall thickness. Right ventricular systolic function is normal. There is mildly elevated pulmonary artery systolic pressure. The tricuspid regurgitant velocity is 2.53  m/s, and with an assumed right atrial pressure of 15 mmHg, the estimated right ventricular  systolic pressure is 40.6 mmHg. Left Atrium: Left atrial size was moderately dilated. Right Atrium: Right atrial size was mildly dilated. Pericardium: There is no evidence of pericardial effusion. Presence of epicardial fat layer. Mitral Valve: The mitral valve is degenerative in appearance. Mild to moderate mitral annular calcification. Mild to moderate mitral valve regurgitation. Tricuspid Valve: The tricuspid valve is grossly normal. Tricuspid valve  regurgitation is mild. Aortic Valve: The aortic valve is tricuspid. There is moderate calcification of the aortic valve. There is mild aortic valve annular calcification. Aortic valve regurgitation is not visualized. Moderate aortic stenosis is present. Aortic valve mean gradient measures 22.5 mmHg. Aortic valve peak gradient measures 36.8 mmHg. Aortic valve area, by VTI measures 0.61 cm. Pulmonic Valve: The pulmonic valve was grossly normal. Pulmonic valve regurgitation is mild. Aorta: The aortic root is normal in size and structure. Venous: The inferior vena cava is dilated in size with less than 50% respiratory variability, suggesting right atrial pressure of 15 mmHg. IAS/Shunts: No atrial level shunt detected by color flow Doppler. Additional Comments: 3D was performed not requiring image post processing on an independent workstation and was indeterminate.  LEFT VENTRICLE PLAX 2D LVIDd:         4.80 cm LVIDs:         2.90 cm LV PW:         1.20 cm LV IVS:        1.30 cm LVOT diam:     1.60 cm LV SV:         34 LV SV Index:   16 LVOT Area:     2.01 cm  RIGHT VENTRICLE RV S prime:     12.60 cm/s TAPSE (M-mode): 1.6 cm LEFT ATRIUM              Index        RIGHT ATRIUM           Index LA diam:        5.30 cm  2.47 cm/m   RA Area:     20.90 cm LA Vol (A2C):   121.0 ml 56.31 ml/m  RA Volume:   67.10 ml  31.22 ml/m LA Vol (A4C):   85.3 ml  39.69 ml/m LA Biplane Vol: 106.0 ml 49.33 ml/m  AORTIC VALVE AV Area (Vmax):    0.62 cm AV Area (Vmean):   0.67 cm AV Area  (VTI):     0.61 cm AV Vmax:           303.50 cm/s AV Vmean:          221.250 cm/s AV VTI:            0.560 m AV Peak Grad:      36.8 mmHg AV Mean Grad:      22.5 mmHg LVOT Vmax:         94.30 cm/s LVOT Vmean:        73.900 cm/s LVOT VTI:          0.171 m LVOT/AV VTI ratio: 0.31  AORTA Ao Root diam: 3.10 cm MITRAL VALVE                TRICUSPID VALVE MV Area (PHT): 3.37 cm     TR Peak grad:   25.6 mmHg MV Decel Time: 225 msec     TR Vmax:        253.00 cm/s MR Peak grad: 98.8 mmHg MR Mean grad: 73.0 mmHg     SHUNTS MR Vmax:      497.00 cm/s   Systemic VTI:  0.17 m MR Vmean:     412.0 cm/s    Systemic Diam: 1.60 cm MV E velocity: 197.00 cm/s Jayson Sierras MD Electronically signed by Jayson Sierras MD Signature Date/Time: 05/07/2024/4:30:06 PM    Final    US  EKG SITE RITE Result Date: 05/06/2024 If Site Rite image not  attached, placement could not be confirmed due to current cardiac rhythm.  DG CHEST PORT 1 VIEW Result Date: 05/06/2024 EXAM: 1 VIEW(S) XRAY OF THE CHEST 05/06/2024 05:07:00 PM COMPARISON: 05/06/2024 CLINICAL HISTORY: Shortness of breath. FINDINGS: LINES, TUBES AND DEVICES: Multiple wires and leads project over the chest on the frontal radiograph. LUNGS AND PLEURA: Diffuse lower lung predominantly interstitial and airspace disease is increased, especially on the left. Limited evaluation for pleural effusion secondary to patient body habitus. No large pleural effusion. No pneumothorax. HEART AND MEDIASTINUM: Moderate cardiomegaly. BONES AND SOFT TISSUES: No acute osseous abnormality. IMPRESSION: 1. technique degradation is detailed above. 2. Worsened diffuse interstitial and airspace disease, especially on the left, favoring pulmonary edema.concurrent infection cannot be excluded. Electronically signed by: Rockey Kilts MD 05/06/2024 05:58 PM EST RP Workstation: [id number]   DG Chest Port 1 View Result Date: 05/06/2024 CLINICAL DATA:  Shortness of breath. EXAM: PORTABLE CHEST 1 VIEW COMPARISON:   04/11/2024. FINDINGS: Cardiomegaly, unchanged. Aortic atherosclerosis. Bilateral interstitial opacities, most pronounced at the lung bases, slightly more pronounced compared to the prior exam. No pleural effusion or pneumothorax. No acute osseous abnormality. IMPRESSION: 1. Bilateral interstitial opacities are slightly more pronounced compared to the prior exam and may reflect interstitial pulmonary edema. 2. Cardiomegaly. Electronically Signed   By: Harrietta Sherry M.D.   On: 05/06/2024 11:21   US  Renal Result Date: 04/11/2024 CLINICAL DATA:  Chronic kidney disease. EXAM: RENAL / URINARY TRACT ULTRASOUND COMPLETE COMPARISON:  12/27/2023 FINDINGS: Exam somewhat limited due to patient body habitus. Right Kidney: Renal measurements: 9.5 x 3.7 x 4.3 cm = volume: 79 mL. Minimal renal cortical thinning. Echogenicity within normal limits. No mass or hydronephrosis visualized. Left Kidney: Renal measurements: 12.4 x 6.3 x 5.0 cm = volume: 204 ML. Echogenicity within normal limits. No mass or hydronephrosis visualized. 2.7 cm cyst over the upper pole and 2.1 cm cyst over the lower pole. Bladder: Appears normal for degree of bladder distention. Ureteral jets not visualized. Other: None. IMPRESSION: 1. Normal size kidneys without hydronephrosis. 2.  Mild right renal cortical thinning. 3. Two left renal cysts with the largest measuring 2.7 cm. Electronically Signed   By: Toribio Agreste M.D.   On: 04/11/2024 14:09   DG Chest Port 1 View Result Date: 04/11/2024 CLINICAL DATA:  Shortness of breath EXAM: PORTABLE CHEST 1 VIEW COMPARISON:  None Available. FINDINGS: Normal lung volumes. Mild interstitial opacities. Patchy left basilar opacity. No pleural effusion or pneumothorax. Enlarged cardiomediastinal silhouette. Irregularity of the proximal left humeral head, likely degenerative. IMPRESSION: 1. Mild interstitial opacities, which may represent pulmonary edema. 2. Patchy left basilar opacity, which may represent  atelectasis, aspiration, or pneumonia. 3. Cardiomegaly. Electronically Signed   By: Limin  Xu M.D.   On: 04/11/2024 12:19    Microbiology: Results for orders placed or performed during the hospital encounter of 05/06/24  Resp panel by RT-PCR (RSV, Flu A&B, Covid) Anterior Nasal Swab     Status: None   Collection Time: 05/06/24 10:27 AM   Specimen: Anterior Nasal Swab  Result Value Ref Range Status   SARS Coronavirus 2 by RT PCR NEGATIVE NEGATIVE Final    Comment: (NOTE) SARS-CoV-2 target nucleic acids are NOT DETECTED.  The SARS-CoV-2 RNA is generally detectable in upper respiratory specimens during the acute phase of infection. The lowest concentration of SARS-CoV-2 viral copies this assay can detect is 138 copies/mL. A negative result does not preclude SARS-Cov-2 infection and should not be used as the sole basis for treatment or other  patient management decisions. A negative result may occur with  improper specimen collection/handling, submission of specimen other than nasopharyngeal swab, presence of viral mutation(s) within the areas targeted by this assay, and inadequate number of viral copies(<138 copies/mL). A negative result must be combined with clinical observations, patient history, and epidemiological information. The expected result is Negative.  Fact Sheet for Patients:  bloggercourse.com  Fact Sheet for Healthcare Providers:  seriousbroker.it  This test is no t yet approved or cleared by the [place]  FDA and  has been authorized for detection and/or diagnosis of SARS-CoV-2 by FDA under an Emergency Use Authorization (EUA). This EUA will remain  in effect (meaning this test can be used) for the duration of the COVID-19 declaration under Section 564(b)(1) of the Act, 21 U.S.C.section 360bbb-3(b)(1), unless the authorization is terminated  or revoked sooner.       Influenza A by PCR NEGATIVE NEGATIVE Final    Influenza B by PCR NEGATIVE NEGATIVE Final    Comment: (NOTE) The Xpert Xpress SARS-CoV-2/FLU/RSV plus assay is intended as an aid in the diagnosis of influenza from Nasopharyngeal swab specimens and should not be used as a sole basis for treatment. Nasal washings and aspirates are unacceptable for Xpert Xpress SARS-CoV-2/FLU/RSV testing.  Fact Sheet for Patients: bloggercourse.com  Fact Sheet for Healthcare Providers: seriousbroker.it  This test is not yet approved or cleared by the [place]  FDA and has been authorized for detection and/or diagnosis of SARS-CoV-2 by FDA under an Emergency Use Authorization (EUA). This EUA will remain in effect (meaning this test can be used) for the duration of the COVID-19 declaration under Section 564(b)(1) of the Act, 21 U.S.C. section 360bbb-3(b)(1), unless the authorization is terminated or revoked.     Resp Syncytial Virus by PCR NEGATIVE NEGATIVE Final    Comment: (NOTE) Fact Sheet for Patients: bloggercourse.com  Fact Sheet for Healthcare Providers: seriousbroker.it  This test is not yet approved or cleared by the [place]  FDA and has been authorized for detection and/or diagnosis of SARS-CoV-2 by FDA under an Emergency Use Authorization (EUA). This EUA will remain in effect (meaning this test can be used) for the duration of the COVID-19 declaration under Section 564(b)(1) of the Act, 21 U.S.C. section 360bbb-3(b)(1), unless the authorization is terminated or revoked.  Performed at [facility], [address], [place], [place] [address]   MRSA Next Gen by PCR, Nasal     Status: None   Collection Time: 05/06/24  6:23 PM   Specimen: Nasal Mucosa; Nasal Swab  Result Value Ref Range Status   MRSA by PCR Next Gen NOT DETECTED NOT DETECTED Final    Comment: (NOTE) The GeneXpert MRSA Assay (FDA approved for NASAL specimens  only), is one component of a comprehensive MRSA colonization surveillance program. It is not intended to diagnose MRSA infection nor to guide or monitor treatment for MRSA infections. Test performance is not FDA approved in patients less than 36 years old. Performed at [facility], [address] [place], [place] [address]   Culture, blood (Routine X 2) w Reflex to ID Panel     Status: None (Preliminary result)   Collection Time: 05/07/24 10:44 AM   Specimen: BLOOD  Result Value Ref Range Status   Specimen Description BLOOD BLOOD RIGHT HAND  Final   Special Requests   Final    BOTTLES DRAWN AEROBIC AND ANAEROBIC Blood Culture adequate volume   Culture   Final    NO GROWTH < 24 HOURS Performed at  [facility], [address], [place], [place] [address]    Report Status PENDING  Incomplete  Culture, blood (Routine X 2) w Reflex to ID Panel     Status: None (Preliminary result)   Collection Time: 05/07/24 10:44 AM   Specimen: BLOOD  Result Value Ref Range Status   Specimen Description BLOOD BLOOD LEFT HAND  Final   Special Requests   Final    BOTTLES DRAWN AEROBIC AND ANAEROBIC Blood Culture adequate volume   Culture   Final    NO GROWTH < 24 HOURS Performed at [facility], [address] [place], [place] [address]    Report Status PENDING  Incomplete    Labs: CBC: Recent Labs  Lab 05/06/24 1209 05/07/24 0529 05/08/24 0315 05/09/24 0441  WBC 8.3 12.0* 10.5 9.1  NEUTROABS 6.7  --  7.6 6.6  HGB 9.7* 8.6* 8.7* 9.4*  HCT 29.6* 26.2* 26.7* 28.8*  MCV 92.5 91.3 91.4 92.0  PLT 163 143* 148* 176   Basic Metabolic Panel: Recent Labs  Lab 05/06/24 1150 05/06/24 1957 05/07/24 0522 05/07/24 0529 05/08/24 0315 05/09/24 0441  NA 137 139  --  140 136 137  K 3.0* 4.0  --  3.8 3.0* 3.4*  CL 97* 99  --  99 92* 91*  CO2 22 26  --  28 31 28   GLUCOSE 140* 155*  --  133* 108* 123*  BUN 53* 53*  --  56* 59* 66*  CREATININE 2.04* 2.06*  --  2.39* 2.46* 2.56*  CALCIUM 9.5 9.4  --   9.4 9.3 9.8  MG 2.5*  --  2.2  --  2.4  --    Liver Function Tests: Recent Labs  Lab 05/06/24 1150 05/07/24 0529  AST 28 32  ALT 21 17  ALKPHOS 114 97  BILITOT 1.2 1.5*  PROT 7.4 6.6  ALBUMIN 4.5 3.9   CBG: Recent Labs  Lab 05/08/24 0834 05/08/24 1134 05/08/24 1654 05/08/24 2123 05/09/24 0748  GLUCAP 112* 121* 115* 120* 131*    Discharge time spent: 44 minutes.  Signed: Deliliah Room, MD [facility] 05/09/2024

## 2024-05-09 NOTE — Plan of Care (Signed)
   Problem: Education: Goal: Knowledge of General Education information will improve Description Including pain rating scale, medication(s)/side effects and non-pharmacologic comfort measures Outcome: Progressing   Problem: Health Behavior/Discharge Planning: Goal: Ability to manage health-related needs will improve Outcome: Progressing

## 2024-05-09 NOTE — Progress Notes (Signed)
 SATURATION QUALIFICATIONS:   Patient Saturations on Room Air at Rest = 95%  Patient Saturations on Room Air while Ambulating = 89%  Patient tolerated activity well without oxygen.

## 2024-05-09 NOTE — Care Management Important Message (Signed)
 Important Message  Patient Details  Name: Katherine Hanson MRN: [id number] Date of Birth: 10-25-1940   Important Message Given:  N/A - LOS <3 / Initial given by admissions     Duwaine LITTIE Ada 05/09/2024, 11:29 AM

## 2024-05-09 NOTE — Progress Notes (Addendum)
 Progress Note  Patient Name: Katherine Hanson Date of Encounter: 05/09/2024  Primary Cardiologist: Ozell Stands, DO [facility])   Interval Summary  Chart reviewed.,  Breathing comfortably.  No chest pain or palpitations.  Net urine output of approximately 3700 cc last 24 hours.  She has remained on Lasix  infusion.  Vital Signs  Vitals:   05/09/24 0323 05/09/24 0400 05/09/24 0754 05/09/24 0926  BP:  104/62  (!) 112/58  Pulse:  66    Resp:  (!) 22    Temp: 98.2 F (36.8 C)  97.7 F (36.5 C)   TempSrc: Oral  Oral   SpO2:  99%    Weight:      Height:        Intake/Output Summary (Last 24 hours) at 05/09/2024 0956 Last data filed at 05/09/2024 0904 Gross per 24 hour  Intake 448.99 ml  Output 1200 ml  Net -751.01 ml   Filed Weights   05/06/24 0953 05/07/24 0426 05/08/24 0411  Weight: 115.7 kg 116.3 kg 109.9 kg    Physical Exam  GEN: No acute distress.   Neck: No JVD. Cardiac: RRR, no murmur, rub, or gallop.  Respiratory: Nonlabored. Clear to auscultation bilaterally. GI: Soft, nontender, bowel sounds present. MS: No edema. Neuro:  Nonfocal. Psych: Alert and oriented x 3. Normal affect.  ECG/Telemetry  Telemetry reviewed showing atrial fibrillation, heart rate 100-110.  Labs  Chemistry Recent Labs  Lab 05/06/24 1150 05/06/24 1957 05/07/24 0529 05/08/24 0315 05/09/24 0441  NA 137   < > 140 136 137  K 3.0*   < > 3.8 3.0* 3.4*  CL 97*   < > 99 92* 91*  CO2 22   < > 28 31 28   GLUCOSE 140*   < > 133* 108* 123*  BUN 53*   < > 56* 59* 66*  CREATININE 2.04*   < > 2.39* 2.46* 2.56*  CALCIUM 9.5   < > 9.4 9.3 9.8  PROT 7.4  --  6.6  --   --   ALBUMIN 4.5  --  3.9  --   --   AST 28  --  32  --   --   ALT 21  --  17  --   --   ALKPHOS 114  --  97  --   --   BILITOT 1.2  --  1.5*  --   --   GFRNONAA 24*   < > 20* 19* 18*  ANIONGAP 18*   < > 12 13 18*   < > = values in this interval not displayed.    Hematology Recent Labs  Lab 05/07/24 0529  05/08/24 0315 05/09/24 0441  WBC 12.0* 10.5 9.1  RBC 2.87* 2.92* 3.13*  HGB 8.6* 8.7* 9.4*  HCT 26.2* 26.7* 28.8*  MCV 91.3 91.4 92.0  MCH 30.0 29.8 30.0  MCHC 32.8 32.6 32.6  RDW 15.6* 15.4 15.1  PLT 143* 148* 176   Cardiac Enzymes Recent Labs  Lab 05/06/24 1740 05/06/24 1957 05/07/24 0522  TRNPT 180* 242* 496*    Cardiac Studies  Echocardiogram 05/07/2024:  1. Left ventricular ejection fraction, by estimation, is 60 to 65%. The  left ventricle has normal function. The left ventricle has no regional  wall motion abnormalities. There is mild concentric left ventricular  hypertrophy. Left ventricular diastolic  parameters are indeterminate.   2. Right ventricular systolic function is normal. The right ventricular  size is normal. There is mildly elevated pulmonary artery systolic  pressure. The estimated right ventricular systolic pressure is 40.6 mmHg.   3. Left atrial size was moderately dilated.   4. Right atrial size was mildly dilated.   5. The mitral valve is degenerative. Mild to moderate mitral valve  regurgitation.   6. The aortic valve is tricuspid. There is moderate calcification of the  aortic valve. Aortic valve regurgitation is not visualized. Moderate  aortic valve stenosis. Aortic valve mean gradient measures 22.5 mmHg.  Dimentionless index 0.31.   7. The inferior vena cava is dilated in size with <50% respiratory  variability, suggesting right atrial pressure of 15 mmHg.   Assessment & Plan  1.  HFpEF, acute on chronic exacerbation.  Follow-up echocardiogram shows LVEF 60 to 65% with mild LVH, normal RV contraction and mildly elevated RVSP.   Pt has diuresed since admit   Feeling better (oxygen is still marginal though)  Exam difficult due to size but overall does not appear bad     2.  Permanent atrial fibrillation per chart review, CHA2DS2-VASc score is 6.   HR controlled on current dosing of meds  Keep on Eliquis     3  Multivessel CAD by  cardiac catheterization at [facility] in 2021 status post DES to the mid LAD.  Elevated high-sensitivity troponin felt due to demand ischemia in setting of afib and HFpEF No CP  Follow   Keep on lipitor   4  HL  Keep on lipitor  5  CV dz  Moderate  It appears that she is on plavix for this but with use of ELiquis I would stop  Data does not support dual use in setting of mod CV dz  ' Keep on lipitor    6.  CKD stage IIIb with AKI, creatinine 2.56 (up for 2.46 yesterday).  She does follow with nephrology as an outpatient.  Will need close follow up  She was on lasix  IV   NAt home had been taking 20 bid lasix     I would recomm 40 mg daily    Again, needs BMET in 1 wk    Needs very close follow up in renal and cardiology after d/c   Pt to follow up with [facility] For questions or updates, please contact [facility] Please consult [email or web address] for contact info under   Signed, Vina Gull, MD  05/09/2024, 9:56 AM

## 2024-05-09 NOTE — Evaluation (Signed)
 Occupational Therapy Evaluation Patient Details Name: Katherine Hanson MRN: [id number] DOB: 10/10/1940 Today's Date: 05/09/2024   History of Present Illness   Katherine Hanson is a 83 y.o. female with medical history significant of CAD, bilateral carotid artery disease on Plavix, HFpEF, permanent atrial fibrillation on Eliquis , hypertension, hyperlipidemia, obesity who presents to the ER with complaints of generalized weakness.  Patient reports of nasal bleeding overnight which she reports was excessive.  She reports losing a lot of blood and feeling extremely weak and fatigued.  Patient also has history of CKD and follows up with nephrology.  She saw nephrologist on Saturday, 2 day prior to presentation when Lasix  dose was increased to twice daily.  Patient reports gaining weight about 6 pounds in past few days  She denies any fever, chills, cough or chest pain.  She just feels tired.  Has some difficulty walking longer distance.Katherine Hanson is a 83 y.o. female with medical history significant of CAD, bilateral carotid artery disease on Plavix, HFpEF, permanent atrial fibrillation on Eliquis , hypertension, hyperlipidemia, obesity who presents to the ER with complaints of generalized weakness.  Patient reports of nasal bleeding overnight which she reports was excessive.  She reports losing a lot of blood and feeling extremely weak and fatigued.  Patient also has history of CKD and follows up with nephrology.  She saw nephrologist on Saturday, 2 day prior to presentation when Lasix  dose was increased to twice daily.  Patient reports gaining weight about 6 pounds in past few days  She denies any fever, chills, cough or chest pain.  She just feels tired.  Has some difficulty walking longer distance.     Clinical Impressions Pt agreeable to OT and PT co-evaluation. Pt is independent at baseline. Pt able to tolerate room air with mild desaturation that returned to >90% SpO2 without supplemental O2. Pt  uses a reacher for lower body dressing at baseline. WFL B UE strength today. Tub bench recommended based on report that shower transfers can be difficult. Pt sleeps in a recliner at baseline. Pt able to ambulate with CGA using RW. More stability noted with use of RW. Pt left in the chair with family present. Pt is not recommended for any further acute OT services and will be discharged to care of nursing staff for remaining length of stay.         If plan is discharge home, recommend the following:   A little help with walking and/or transfers     Functional Status Assessment   Patient has not had a recent decline in their functional status     Equipment Recommendations   Tub/shower bench (Due to reports from daughter that shower transfers can be difficult.)             Precautions/Restrictions   Precautions Precautions: Fall Recall of Precautions/Restrictions: Intact Restrictions Weight Bearing Restrictions Per Provider Order: No     Mobility Bed Mobility               General bed mobility comments: Pt in chair to start session; sleeps in recliner.    Transfers Overall transfer level: Needs assistance Equipment used: Quad cane Transfers: Sit to/from Stand Sit to Stand: Contact guard assist           General transfer comment: CGA for sit to stand from EOB using quad cane. Pt took a few steps with quad cane but was directed to use RW by Physical Therapy due to instability.  Balance Overall balance assessment: Needs assistance Sitting-balance support: No upper extremity supported, Feet supported Sitting balance-Leahy Scale: Fair Sitting balance - Comments: fair to good in recliner   Standing balance support: Bilateral upper extremity supported, During functional activity, Reliant on assistive device for balance Standing balance-Leahy Scale: Fair Standing balance comment: fair with RW                           ADL either  performed or assessed with clinical judgement   ADL Overall ADL's : Needs assistance/impaired     Grooming: Standing;Supervision/safety;Contact guard assist (cane or RW)       Lower Body Bathing: Set up;Sitting/lateral leans       Lower Body Dressing: Set up;Sitting/lateral leans Lower Body Dressing Details (indicate cue type and reason): Pt uses reacher at baseline. Toilet Transfer: Contact guard assist;Rolling walker (2 wheels) Toilet Transfer Details (indicate cue type and reason): Simulated via ambulation in the hall with RW. Toileting- Clothing Manipulation and Hygiene: Set up;Sitting/lateral lean       Functional mobility during ADLs: Contact guard assist;Rolling walker (2 wheels)       Vision Baseline Vision/History: 1 Wears glasses Ability to See in Adequate Light: 1 Impaired Patient Visual Report: No change from baseline Vision Assessment?: No apparent visual deficits     Perception Perception: Not tested       Praxis Praxis: Not tested       Pertinent Vitals/Pain Pain Assessment Pain Assessment: No/denies pain     Extremity/Trunk Assessment Upper Extremity Assessment Upper Extremity Assessment: Overall WFL for tasks assessed   Lower Extremity Assessment Lower Extremity Assessment: Defer to PT evaluation   Cervical / Trunk Assessment Cervical / Trunk Assessment: Kyphotic   Communication Communication Communication: No apparent difficulties   Cognition Arousal: Alert Behavior During Therapy: WFL for tasks assessed/performed Cognition: No apparent impairments                               Following commands: Intact       Cueing  General Comments   Cueing Techniques: Verbal cues;Tactile cues  Pt desaturated to low of ~87% while ambulating without O2. SpO2 increased to >90 while still on room air.              Home Living Family/patient expects to be discharged to:: Private residence Living Arrangements: Alone Available  Help at Discharge: Family;Available PRN/intermittently Type of Home: House Home Access: Stairs to enter [employer] of Steps: 3 Entrance Stairs-Rails: Can reach both;Left;Right Home Layout: One level     Bathroom Shower/Tub: Chief Strategy Officer: Handicapped height Bathroom Accessibility: Yes How Accessible: Accessible via wheelchair;Accessible via walker Home Equipment: Rollator (4 wheels);Cane - quad;Other (comment) (Rails over toilet; stand up walker)          Prior Functioning/Environment Prior Level of Function : Independent/Modified Independent;Driving             Mobility Comments: Rollator in the house; quad cane in community ADLs Comments: Independent                            Co-evaluation PT/OT/SLP Co-Evaluation/Treatment: Yes Reason for Co-Treatment: To address functional/ADL transfers   OT goals addressed during session: ADL's and self-care  End of Session Equipment Utilized During Treatment: Rolling walker (2 wheels);Gait belt;Other (comment) (quad cane)  Activity Tolerance: Patient tolerated treatment well Patient left: in chair;with call bell/phone within reach;with family/visitor present  OT Visit Diagnosis: Unsteadiness on feet (R26.81);Other abnormalities of gait and mobility (R26.89)                Time: 9093-9078 OT Time Calculation (min): 15 min Charges:  OT General Charges $OT Visit: 1 Visit OT Evaluation $OT Eval Low Complexity: 1 Low  Nitara Szczerba OT, MOT  Jayson Person 05/09/2024, 11:16 AM

## 2024-05-09 NOTE — Evaluation (Signed)
 Physical Therapy Evaluation Patient Details Name: Katherine Hanson MRN: [id number] DOB: 01-03-1941 Today's Date: 05/09/2024  History of Present Illness  Katherine Hanson is a 83 y.o. 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  Patient reports of nasal bleeding overnight which she reports was excessive.  She reports losing a lot of blood and feeling extremely weak and fatigued.  Patient also has history of CKD and follows up with nephrology.  She saw nephrologist on Saturday, 2 day prior to presentation when Lasix  dose was increased to twice daily.  Patient reports gaining weight about 6 pounds in past few days  She denies any fever, chills, cough or chest pain.  She just feels tired.  Has some difficulty walking longer distance.Katherine Hanson is a 83 y.o. 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  Patient reports of nasal bleeding overnight which she reports was excessive.  She reports losing a lot of blood and feeling extremely weak and fatigued.  Patient also has history of CKD and follows up with nephrology.  She saw nephrologist on Saturday, 2 day prior to presentation when Lasix  dose was increased to twice daily.  Patient reports gaining weight about 6 pounds in past few days  She denies any fever, chills, cough or chest pain.  She just feels tired.  Has some difficulty walking longer distance.    Clinical Impression  Pt presented w/ general weakness and mild fatigue due to dx. Co-treated w/ OT. Pt was able to tolerated session well. Prior to treatment was in chair. Pt uses recliner at home to sleep in. Pt  was able to perform sit to stand transfer w/ quad cane w/ CGA. Initially during ambulation used quad cane and was advise to RW to assist w/ stability, throughout ambulation w/ RW pt required CGA. Pt. SpO2 throughout ambulation reminded b/w 88%-95% on room air.  Pt ws left in chair w/ call bell and daughter at bedside, nursing staff present in room.  PLAN:  Patient to be discharged home today and discharged from acute physical therapy with recommendations stated below.        If plan is discharge home, recommend the following: A little help with walking and/or transfers;Assist for transportation;Assistance with cooking/housework   Can travel by private vehicle    Yes    Equipment Recommendations None recommended by PT  Recommendations for Other Services       Functional Status Assessment Patient has had a recent decline in their functional status and demonstrates the ability to make significant improvements in function in a reasonable and predictable amount of time.     Precautions / Restrictions Precautions Precautions: Fall Recall of Precautions/Restrictions: Intact Restrictions Weight Bearing Restrictions Per Provider Order: No      Mobility  Bed Mobility               General bed mobility comments: Pt in chair to start session; sleeps in recliner.    Transfers Overall transfer level: Needs assistance Equipment used: Quad cane Transfers: Sit to/from Stand Sit to Stand: Contact guard assist           General transfer comment: to EOB, used quad to transfer from chair to bed  Ambulation/Gait Ambulation/Gait assistance: Contact guard assist   Assistive device: Quad cane, Rolling walker (2 wheels) Gait Pattern/deviations: Step-through pattern, Decreased step length - left, Decreased stance time - right, Decreased stance time - left, Decreased step length - right       General Gait Details: initally pt used quad cane, pt felt unstable, was advised to use RW to  improve stability. Pt was able to ambulat einto hallway, and maintain SpO2 88%-97% on room air  Stairs            Wheelchair Mobility     Tilt Bed    Modified Rankin (Stroke Patients Only)       Balance Overall balance assessment: Needs assistance Sitting-balance support: No upper extremity supported, Feet supported Sitting balance-Leahy Scale: Fair Sitting balance - Comments: fair to good in recliner   Standing balance support: Bilateral upper extremity supported, During functional activity, Reliant on assistive device for balance Standing balance-Leahy Scale: Fair Standing balance comment: fair with RW                             Pertinent Vitals/Pain Pain Assessment Pain Assessment: No/denies pain    Home Living Family/patient expects to be discharged to:: Private residence Living Arrangements: Alone Available Help at Discharge: Family;Available PRN/intermittently Type of Home: House Home Access: Stairs to enter Entrance Stairs-Rails: Can reach both;Left;Right Entrance Stairs-Number of Steps: 3   Home Layout: One level Home Equipment: Rollator (4 wheels);Cane - quad;Other (comment)      Prior Function Prior Level of Function : Independent/Modified Independent;Driving             Mobility Comments: Rollator in the house; quad cane in community ADLs Comments: Independent     Extremity/Trunk Assessment   Upper Extremity Assessment Upper Extremity Assessment: Defer to OT evaluation    Lower Extremity Assessment Lower Extremity Assessment: Generalized weakness    Cervical / Trunk Assessment Cervical / Trunk Assessment: Kyphotic  Communication   Communication Communication: No apparent difficulties    Cognition Arousal: Alert Behavior During Therapy: WFL for tasks assessed/performed   PT - Cognitive impairments: No apparent impairments                         Following commands: Intact       Cueing Cueing  Techniques: Verbal cues, Tactile cues     General Comments General comments (skin integrity, edema, etc.): during functional task within treatment pt SpO2 was initially 95%, during ambulation spO2 was 88%-95% on room air . End of treatment it remained at 96% on room air    Exercises     Assessment/Plan    PT Assessment Patient needs continued PT services  PT Problem List Decreased strength;Decreased activity tolerance;Decreased balance;Decreased mobility;Decreased coordination       PT Treatment Interventions DME instruction;Gait training;Stair training;Patient/family education;Functional mobility training;Therapeutic activities;Therapeutic exercise;Balance training    PT Goals (Current goals can be found in the Care Plan section)  Acute Rehab PT Goals Patient Stated Goal: return home PT Goal Formulation: With patient/family Time For Goal Achievement: 05/09/24 Potential to Achieve Goals: Good    Frequency Min 3X/week     Co-evaluation PT/OT/SLP Co-Evaluation/Treatment: Yes Reason for Co-Treatment: To address functional/ADL transfers PT goals addressed during session: Mobility/safety with mobility         AM-PAC PT 6 Clicks Mobility  Outcome Measure Help needed turning from your back to your side while in a  flat bed without using bedrails?: None Help needed moving from lying on your back to sitting on the side of a flat bed without using bedrails?: None Help needed moving to and from a bed to a chair (including a wheelchair)?: A Little Help needed standing up from a chair using your arms (e.g., wheelchair or bedside chair)?: A Little Help needed to walk in hospital room?: A Little Help needed climbing 3-5 steps with a railing? : A Lot 6 Click Score: 19    End of Session Equipment Utilized During Treatment: Gait belt Activity Tolerance: Patient tolerated treatment well;Patient limited by fatigue Patient left: in chair;with call bell/phone within reach;with  family/visitor present Nurse Communication: Mobility status PT Visit Diagnosis: Unsteadiness on feet (R26.81);Muscle weakness (generalized) (M62.81);History of falling (Z91.81)    Time: 9096-9078 PT Time Calculation (min) (ACUTE ONLY): 18 min   Charges:   PT Evaluation $PT Eval Low Complexity: 1 Low PT Treatments $Therapeutic Activity: 8-22 mins PT General Charges $$ ACUTE PT VISIT: 1 Visit        Ivery Cable, SPT

## 2024-05-12 LAB — CULTURE, BLOOD (ROUTINE X 2)
Culture: NO GROWTH
Culture: NO GROWTH
Special Requests: ADEQUATE
Special Requests: ADEQUATE

## 2024-07-10 ENCOUNTER — Inpatient Hospital Stay

## 2024-07-17 ENCOUNTER — Inpatient Hospital Stay: Admitting: Physician Assistant

## 2024-07-19 ENCOUNTER — Ambulatory Visit: Admitting: Urology
